# Patient Record
Sex: Female | Born: 1970 | Race: White | Hispanic: No | Marital: Married | Smoking: Never smoker
Health system: Southern US, Community
[De-identification: ages and names within clinical notes are randomized; demographics above are authoritative.]

## PROBLEM LIST (undated history)

## (undated) DIAGNOSIS — R112 Nausea with vomiting, unspecified: Secondary | ICD-10-CM

## (undated) DIAGNOSIS — Z9889 Other specified postprocedural states: Secondary | ICD-10-CM

## (undated) DIAGNOSIS — K219 Gastro-esophageal reflux disease without esophagitis: Secondary | ICD-10-CM

## (undated) HISTORY — PX: TONSILLECTOMY: SUR1361

---

## 2012-02-23 ENCOUNTER — Emergency Department: Payer: Self-pay | Admitting: Emergency Medicine

## 2012-02-23 LAB — BASIC METABOLIC PANEL
Calcium, Total: 11.2 mg/dL — ABNORMAL HIGH (ref 8.5–10.1)
Co2: 23 mmol/L (ref 21–32)
Creatinine: 0.67 mg/dL (ref 0.60–1.30)
EGFR (African American): >60
EGFR (Non-African Amer.): >60
Glucose: 96 mg/dL (ref 65–99)
Potassium: 4 mmol/L (ref 3.5–5.1)
Sodium: 140 mmol/L (ref 136–145)

## 2012-02-23 LAB — CK TOTAL AND CKMB (NOT AT ARMC)
CK, Total: 69 U/L (ref 21–215)
CK-MB: 0.8 ng/mL (ref 0.5–3.6)

## 2012-02-23 LAB — TROPONIN I
Troponin-I: <0.02 ng/mL
Troponin-I: <0.02 ng/mL

## 2012-02-23 LAB — CBC
HCT: 41 % (ref 35.0–47.0)
Platelet: 290 10*3/uL (ref 150–440)
RBC: 4.7 10*6/uL (ref 3.80–5.20)
RDW: 14.6 % — ABNORMAL HIGH (ref 11.5–14.5)
WBC: 7 10*3/uL (ref 3.6–11.0)

## 2016-07-21 ENCOUNTER — Other Ambulatory Visit: Payer: Self-pay | Admitting: Obstetrics and Gynecology

## 2016-07-21 DIAGNOSIS — N63 Unspecified lump in unspecified breast: Secondary | ICD-10-CM

## 2016-07-25 ENCOUNTER — Other Ambulatory Visit: Payer: Self-pay | Admitting: Obstetrics and Gynecology

## 2016-07-25 ENCOUNTER — Ambulatory Visit
Admission: RE | Admit: 2016-07-25 | Discharge: 2016-07-25 | Disposition: A | Discharge status: Home / Self Care | Payer: BC Managed Care – PPO | Source: Ambulatory Visit | Attending: Obstetrics and Gynecology | Admitting: Obstetrics and Gynecology

## 2016-07-25 DIAGNOSIS — N6310 Unspecified lump in the right breast, unspecified quadrant: Secondary | ICD-10-CM

## 2016-07-25 DIAGNOSIS — N63 Unspecified lump in unspecified breast: Secondary | ICD-10-CM

## 2016-07-30 ENCOUNTER — Other Ambulatory Visit: Payer: Self-pay | Admitting: Obstetrics and Gynecology

## 2016-07-30 ENCOUNTER — Ambulatory Visit
Admission: RE | Admit: 2016-07-30 | Discharge: 2016-07-30 | Disposition: A | Discharge status: Home / Self Care | Payer: BC Managed Care – PPO | Source: Ambulatory Visit | Attending: Obstetrics and Gynecology | Admitting: Obstetrics and Gynecology

## 2016-07-30 DIAGNOSIS — N6001 Solitary cyst of right breast: Secondary | ICD-10-CM

## 2016-07-30 DIAGNOSIS — N6310 Unspecified lump in the right breast, unspecified quadrant: Secondary | ICD-10-CM

## 2016-07-30 HISTORY — PX: BREAST CYST ASPIRATION: SHX578

## 2017-08-31 ENCOUNTER — Other Ambulatory Visit: Payer: Self-pay | Admitting: Obstetrics and Gynecology

## 2017-08-31 DIAGNOSIS — Z1231 Encounter for screening mammogram for malignant neoplasm of breast: Secondary | ICD-10-CM

## 2017-11-04 ENCOUNTER — Encounter: Payer: Self-pay | Admitting: Obstetrics & Gynecology

## 2017-11-04 ENCOUNTER — Ambulatory Visit: Payer: BC Managed Care – PPO | Admitting: Obstetrics & Gynecology

## 2017-11-04 ENCOUNTER — Ambulatory Visit
Admission: RE | Admit: 2017-11-04 | Discharge: 2017-11-04 | Disposition: A | Discharge status: Home / Self Care | Payer: BC Managed Care – PPO | Source: Ambulatory Visit | Attending: Obstetrics & Gynecology | Admitting: Obstetrics & Gynecology

## 2017-11-04 VITALS — BP 140/90 | Ht 65.0 in | Wt 274.0 lb

## 2017-11-04 DIAGNOSIS — Z124 Encounter for screening for malignant neoplasm of cervix: Secondary | ICD-10-CM

## 2017-11-04 DIAGNOSIS — Z8742 Personal history of other diseases of the female genital tract: Secondary | ICD-10-CM | POA: Diagnosis not present

## 2017-11-04 DIAGNOSIS — R1031 Right lower quadrant pain: Secondary | ICD-10-CM | POA: Diagnosis not present

## 2017-11-04 DIAGNOSIS — D3502 Benign neoplasm of left adrenal gland: Secondary | ICD-10-CM | POA: Diagnosis not present

## 2017-11-04 MED ORDER — CLOTRIMAZOLE-BETAMETHASONE 1-0.05 % EX CREA: 1.0000 "application " | TOPICAL_CREAM | Freq: Two times a day (BID) | CUTANEOUS | 2 refills | Status: DC

## 2017-11-04 MED ORDER — IOPAMIDOL (ISOVUE-370) INJECTION 76%
75.0000 mL | Freq: Once | INTRAVENOUS | Status: AC | PRN
  Administered 2017-11-04: 75 mL via INTRAVENOUS

## 2017-11-04 NOTE — Addendum Note (Signed)
Addended by: Gae Dry on: 11/04/2017 01:53 PM   Modules accepted: Orders

## 2017-11-04 NOTE — Progress Notes (Signed)
Gynecology Pelvic Pain Evaluation   Chief Complaint:  Chief Complaint  Patient presents with  . Pain    right side pain   History of Present Illness:   Patient is a 47 y.o. G0P0000 who LMP was No LMP recorded., presents today for a problem visit.  She complains of pain.   Her pain is localized to the RLQ and this was last night, but today is more on the RUQ radiating to back area, described as sharp and stabbing, began yesterday and its severity is described as yesterday severe and today moderate. The pain radiates to the  back. She has these associated symptoms which include diarrhea, nausea and vomiting. Patient has these modifiers which include pain medication that make it better and unable to associate with any factor that make it worse.  Context includes: spontaneous.  Last PAP 2015.  Never had pregnancy. H/o Clomid use.  Previous evaluation: none. Prior Diagnosis: none.  Does have h/o PCOS. No prior surgeries. Previous Treatment: none.  PMHx: She  has no past medical history on file. Also,  has no past surgical history on file., family history is not on file.,  reports that she has never smoked. She has never used smokeless tobacco. She reports that she drinks alcohol. She reports that she does not use drugs.  She has a current medication list which includes the following prescription(s): clotrimazole-betamethasone. Also, is allergic to penicillins.  Review of Systems  Constitutional: Negative for chills, fever and malaise/fatigue.  HENT: Negative for congestion, sinus pain and sore throat.   Eyes: Negative for blurred vision and pain.  Respiratory: Negative for cough and wheezing.   Cardiovascular: Negative for chest pain and leg swelling.  Gastrointestinal: Negative for abdominal pain, constipation, diarrhea, heartburn, nausea and vomiting.  Genitourinary: Negative for dysuria, frequency, hematuria and urgency.  Musculoskeletal: Negative for back pain, joint pain, myalgias and  neck pain.  Skin: Negative for itching and rash.  Neurological: Negative for dizziness, tremors and weakness.  Endo/Heme/Allergies: Does not bruise/bleed easily.  Psychiatric/Behavioral: Negative for depression. The patient is not nervous/anxious and does not have insomnia.    Objective: BP 140/90   Ht 5\' 5"  (1.651 m)   Wt 274 lb (124.3 kg)   BMI 45.60 kg/m  Physical Exam  Constitutional: She is oriented to person, place, and time. She appears well-developed and well-nourished. No distress.  Genitourinary: Rectum normal, vagina normal and uterus normal. Pelvic exam was performed with patient supine. There is no rash or lesion on the right labia. There is no rash or lesion on the left labia. Vagina exhibits no lesion. No bleeding in the vagina. Right adnexum does not display mass and does not display tenderness. Left adnexum does not display mass and does not display tenderness. Cervix does not exhibit motion tenderness, lesion, friability or polyp.   Uterus is mobile and midaxial. Uterus is not enlarged or exhibiting a mass.  Genitourinary Comments: No mass Mildly T  HENT:  Head: Normocephalic and atraumatic. Head is without laceration.  Right Ear: Hearing normal.  Left Ear: Hearing normal.  Nose: No epistaxis.  No foreign bodies.  Mouth/Throat: Uvula is midline, oropharynx is clear and moist and mucous membranes are normal.  Eyes: Pupils are equal, round, and reactive to light.  Neck: Normal range of motion. Neck supple. No thyromegaly present.  Cardiovascular: Normal rate and regular rhythm. Exam reveals no gallop and no friction rub.  No murmur heard. Pulmonary/Chest: Effort normal and breath sounds normal. No respiratory distress.  She has no wheezes. Right breast exhibits no mass, no skin change and no tenderness. Left breast exhibits no mass, no skin change and no tenderness.  Abdominal: Soft. Bowel sounds are normal. She exhibits no distension. There is tenderness in the right  upper quadrant and right lower quadrant. There is positive Murphy's sign. There is no rigidity, no rebound, no guarding and no tenderness at McBurney's point.  Musculoskeletal: Normal range of motion.  Neurological: She is alert and oriented to person, place, and time. No cranial nerve deficit.  Skin: Skin is warm and dry.  Psychiatric: She has a normal mood and affect. Judgment normal.  Vitals reviewed.  Female chaperone present for pelvic portion of the physical exam  Assessment: 47 y.o. G0P0000 with pain.  1. RLQ abdominal pain, now RUQ pain CT to assess ovary (h/o cysts, this is where pain started) and appendix, gall bladder, other  2. History of PCOS  3. Screening for cervical cancer - IGP, Aptima HPV  Problem List Items Addressed This Visit      Other   RLQ abdominal pain - Primary   Relevant Orders   CBC   BUN   Creatinine   History of PCOS    Other Visit Diagnoses    Screening for cervical cancer       Relevant Orders   IGP, Aptima HPV   Right lower quadrant abdominal pain       Relevant Orders   CT ABDOMEN PELVIS W WO CONTRAST   BUN   Creatinine      Barnett Applebaum, MD, [place], [facility] 11/04/2017  1:39 PM

## 2017-11-05 LAB — CBC
HEMATOCRIT: 38.9 % (ref 34.0–46.6)
HEMOGLOBIN: 12.8 g/dL (ref 11.1–15.9)
MCH: 29.7 pg (ref 26.6–33.0)
MCHC: 32.9 g/dL (ref 31.5–35.7)
MCV: 90 fL (ref 79–97)
Platelets: 284 10*3/uL (ref 150–379)
RBC: 4.31 x10E6/uL (ref 3.77–5.28)
RDW: 14.2 % (ref 12.3–15.4)
WBC: 9.7 10*3/uL (ref 3.4–10.8)

## 2017-11-05 LAB — CREATININE, SERUM
CREATININE: 0.65 mg/dL (ref 0.57–1.00)
GFR calc Af Amer: 124 mL/min/{1.73_m2} (ref >59)
GFR calc non Af Amer: 108 mL/min/{1.73_m2} (ref >59)

## 2017-11-05 LAB — BUN: BUN: 6 mg/dL (ref 6–24)

## 2017-11-06 LAB — IGP, APTIMA HPV
HPV APTIMA: NEGATIVE
PAP SMEAR COMMENT: 0

## 2017-11-11 ENCOUNTER — Ambulatory Visit
Admission: RE | Admit: 2017-11-11 | Discharge: 2017-11-11 | Disposition: A | Discharge status: Home / Self Care | Payer: BC Managed Care – PPO | Source: Ambulatory Visit | Attending: Obstetrics and Gynecology | Admitting: Obstetrics and Gynecology

## 2017-11-11 DIAGNOSIS — Z1231 Encounter for screening mammogram for malignant neoplasm of breast: Secondary | ICD-10-CM

## 2017-11-12 ENCOUNTER — Other Ambulatory Visit: Payer: Self-pay | Admitting: Obstetrics and Gynecology

## 2017-11-12 DIAGNOSIS — N63 Unspecified lump in unspecified breast: Secondary | ICD-10-CM

## 2017-12-14 ENCOUNTER — Ambulatory Visit
Admission: RE | Admit: 2017-12-14 | Discharge: 2017-12-14 | Disposition: A | Discharge status: Home / Self Care | Payer: BC Managed Care – PPO | Source: Ambulatory Visit | Attending: Obstetrics and Gynecology | Admitting: Obstetrics and Gynecology

## 2017-12-14 DIAGNOSIS — N63 Unspecified lump in unspecified breast: Secondary | ICD-10-CM

## 2018-08-09 ENCOUNTER — Encounter: Payer: Self-pay | Admitting: Emergency Medicine

## 2018-08-09 ENCOUNTER — Emergency Department: Payer: BC Managed Care – PPO

## 2018-08-09 ENCOUNTER — Emergency Department
Admission: EM | Admit: 2018-08-09 | Discharge: 2018-08-09 | Disposition: A | Discharge status: Home / Self Care | Payer: BC Managed Care – PPO | Attending: Emergency Medicine | Admitting: Emergency Medicine

## 2018-08-09 ENCOUNTER — Other Ambulatory Visit: Payer: Self-pay

## 2018-08-09 DIAGNOSIS — M5136 Other intervertebral disc degeneration, lumbar region: Secondary | ICD-10-CM | POA: Diagnosis not present

## 2018-08-09 DIAGNOSIS — M47817 Spondylosis without myelopathy or radiculopathy, lumbosacral region: Secondary | ICD-10-CM

## 2018-08-09 DIAGNOSIS — M5489 Other dorsalgia: Secondary | ICD-10-CM | POA: Diagnosis present

## 2018-08-09 LAB — POCT PREGNANCY, URINE: Preg Test, Ur: NEGATIVE

## 2018-08-09 MED ORDER — METHYLPREDNISOLONE SODIUM SUCC 125 MG IJ SOLR
125.0000 mg | Freq: Once | INTRAMUSCULAR | Status: AC
  Administered 2018-08-09: 125 mg via INTRAMUSCULAR
  Filled 2018-08-09: qty 2

## 2018-08-09 MED ORDER — HYDROMORPHONE HCL 1 MG/ML IJ SOLN
1.0000 mg | Freq: Once | INTRAMUSCULAR | Status: AC
  Administered 2018-08-09: 1 mg via INTRAMUSCULAR
  Filled 2018-08-09: qty 1

## 2018-08-09 MED ORDER — CYCLOBENZAPRINE HCL 10 MG PO TABS: 10.0000 mg | ORAL_TABLET | Freq: Three times a day (TID) | ORAL | 0 refills | Status: DC | PRN

## 2018-08-09 MED ORDER — ORPHENADRINE CITRATE 30 MG/ML IJ SOLN
60.0000 mg | Freq: Two times a day (BID) | INTRAMUSCULAR | Status: DC
  Administered 2018-08-09: 60 mg via INTRAMUSCULAR
  Filled 2018-08-09: qty 2

## 2018-08-09 MED ORDER — IBUPROFEN 600 MG PO TABS: 600.0000 mg | ORAL_TABLET | Freq: Three times a day (TID) | ORAL | 0 refills | Status: AC | PRN

## 2018-08-09 MED ORDER — OXYCODONE-ACETAMINOPHEN 7.5-325 MG PO TABS: 1.0000 | ORAL_TABLET | Freq: Four times a day (QID) | ORAL | 0 refills | Status: DC | PRN

## 2018-08-09 NOTE — ED Triage Notes (Signed)
Pt here with c/o mid to lower back pain, states hx of slipped disc, bent over this weekend and felt the pain, slow walk to triage.

## 2018-08-09 NOTE — ED Notes (Signed)
See triage note  States she has a hx of "slipped disc"   States she was leaning over to wash her hair and felt her back slip at that time  States pain is going across lower back  Does not radiate into legs

## 2018-08-09 NOTE — ED Provider Notes (Signed)
[facility] Emergency Department Provider Note   ____________________________________________   First MD Initiated Contact with Patient 08/09/18 [phone]     (approximate)  I have reviewed the triage vital signs and the nursing notes.   HISTORY  Chief Complaint Back Pain    HPI Leslie Waller is a 48 y.o. female patient presents with mid back pain.  Patient states she was bending over washing her hair yesterday when she felt a sharp pain with a component to her lower extremities.  Patient denies bladder bowel dysfunction.  Patient says she has a history of slipped disc in the lumbar spine.  Patient rates her pain as a 8/10.  Patient scribed pain is "sharp".  Patient state pain increases with standing and ambulation.  Patient states pain decreases with laying on right side.    History reviewed. No pertinent past medical history.  Patient Active Problem List   Diagnosis Date Noted  . RLQ abdominal pain 11/04/2017  . History of PCOS 11/04/2017    Past Surgical History:  Procedure Laterality Date  . BREAST CYST ASPIRATION Right 07/30/2016    Prior to Admission medications   Medication Sig Start Date End Date Taking? Authorizing Provider  clotrimazole-betamethasone (LOTRISONE) cream Apply 1 application topically 2 (two) times daily. To affected areas 11/04/17   Gae Dry, MD  cyclobenzaprine (FLEXERIL) 10 MG tablet Take 1 tablet (10 mg total) by mouth 3 (three) times daily as needed. 08/09/18   Sable Feil, PA-C  ibuprofen (ADVIL,MOTRIN) 600 MG tablet Take 1 tablet (600 mg total) by mouth every 8 (eight) hours as needed. 08/09/18   Sable Feil, PA-C  oxyCODONE-acetaminophen (PERCOCET) 7.5-325 MG tablet Take 1 tablet by mouth every 6 (six) hours as needed for severe pain. 08/09/18   Sable Feil, PA-C    Allergies Penicillins  No family history on file.  Social History Social History   Tobacco Use  . Smoking status: Never Smoker  .  Smokeless tobacco: Never Used  Substance Use Topics  . Alcohol use: Yes  . Drug use: Never    Review of Systems Constitutional: No fever/chills Eyes: No visual changes. ENT: No sore throat. Cardiovascular: Denies chest pain. Respiratory: Denies shortness of breath. Gastrointestinal: No abdominal pain.  No nausea, no vomiting.  No diarrhea.  No constipation. Genitourinary: Negative for dysuria. Musculoskeletal: Positive for back pain. Skin: Negative for rash. Neurological: Negative for headaches, focal weakness or numbness. Allergic/Immunilogical: Penicillin ____________________________________________   PHYSICAL EXAM:  VITAL SIGNS: ED Triage Vitals  Enc Vitals Group     BP 08/09/18 0753 138/76     Pulse Rate 08/09/18 0753 (!) 59     Resp 08/09/18 0753 16     Temp 08/09/18 0753 97.8 F (36.6 C)     Temp Source 08/09/18 0753 Oral     SpO2 08/09/18 0753 98 %     Weight 08/09/18 0748 235 lb (106.6 kg)     Height 08/09/18 0748 5\' 5"  (1.651 m)     Head Circumference --      Peak Flow --      Pain Score 08/09/18 0748 8     Pain Loc --      Pain Edu? --      Excl. in [place]? --     Constitutional: Alert and oriented.  Moderate distress.  Hematological/Lymphatic/Immunilogical: No cervical lymphadenopathy. Cardiovascular: Normal rate, regular rhythm. Grossly normal heart sounds.  Good peripheral circulation. Respiratory: Normal respiratory effort.  No retractions.  Lungs CTAB. Gastrointestinal: Soft and nontender. No distention. No abdominal bruits. No CVA tenderness. Musculoskeletal: No obvious spinal deformity.  Patient has moderate guarding palpation of L4-S1.  Neurologic:  Normal speech and language. No gross focal neurologic deficits are appreciated. No gait instability.  Patient has negative straight leg test. Skin:  Skin is warm, dry and intact. No rash noted. Psychiatric: Mood and affect are normal. Speech and behavior are  normal.  ____________________________________________   LABS (all labs ordered are listed, but only abnormal results are displayed)  Labs Reviewed  POCT PREGNANCY, URINE  POC URINE PREG, ED   ____________________________________________  EKG   ____________________________________________  RADIOLOGY  ED MD interpretation:    Official radiology report(s): Dg Lumbar Spine 2-3 Views  Result Date: 08/09/2018 CLINICAL DATA:  Acute low back pain with radiculopathy. EXAM: LUMBAR SPINE - 2-3 VIEW COMPARISON:  CT scan of Nov 04, 2017. FINDINGS: No fracture or spondylolisthesis is noted. Moderate degenerative disc disease is noted at L4-5. Remaining disc spaces are unremarkable. IMPRESSION: Moderate degenerative disc disease is noted at L4-5. No acute abnormality seen in the lumbar spine. Electronically Signed   By: Marijo Conception, M.D.   On: 08/09/2018 09:33    ____________________________________________   PROCEDURES  Procedure(s) performed:   Procedures  Critical Care performed:   ____________________________________________   INITIAL IMPRESSION / ASSESSMENT AND PLAN / ED COURSE  As part of my medical decision making, I reviewed the following data within the [facility]     Patient presents with acute low back pain secondary to prolonged flexion incident yesterday.  Discussed x-ray findings with patient.  Patient given discharge care instruction work note.  Patient advised take medication as directed and follow-up with PCP for continued care.      ____________________________________________   FINAL CLINICAL IMPRESSION(S) / ED DIAGNOSES  Final diagnoses:  DJD (degenerative joint disease), lumbosacral     ED Discharge Orders         Ordered    oxyCODONE-acetaminophen (PERCOCET) 7.5-325 MG tablet  Every 6 hours PRN     08/09/18 0949    cyclobenzaprine (FLEXERIL) 10 MG tablet  3 times daily PRN     08/09/18 0949    ibuprofen (ADVIL,MOTRIN) 600  MG tablet  Every 8 hours PRN     08/09/18 0949           Note:  This document was prepared using Dragon voice recognition software and may include unintentional dictation errors.    Sable Feil, PA-C 08/09/18 1010    Lavonia Drafts, MD 08/09/18 1146

## 2019-01-31 ENCOUNTER — Other Ambulatory Visit: Payer: Self-pay

## 2019-01-31 ENCOUNTER — Other Ambulatory Visit: Payer: Self-pay | Admitting: Obstetrics & Gynecology

## 2019-01-31 DIAGNOSIS — Z1231 Encounter for screening mammogram for malignant neoplasm of breast: Secondary | ICD-10-CM

## 2019-02-01 ENCOUNTER — Ambulatory Visit
Admission: RE | Admit: 2019-02-01 | Discharge: 2019-02-01 | Disposition: A | Discharge status: Home / Self Care | Payer: BC Managed Care – PPO | Source: Ambulatory Visit | Attending: Obstetrics & Gynecology | Admitting: Obstetrics & Gynecology

## 2019-02-01 ENCOUNTER — Other Ambulatory Visit: Payer: Self-pay

## 2019-02-01 DIAGNOSIS — Z1231 Encounter for screening mammogram for malignant neoplasm of breast: Secondary | ICD-10-CM

## 2019-05-16 ENCOUNTER — Other Ambulatory Visit: Payer: Self-pay

## 2019-05-16 DIAGNOSIS — Z20822 Contact with and (suspected) exposure to covid-19: Secondary | ICD-10-CM

## 2019-05-17 LAB — NOVEL CORONAVIRUS, NAA: SARS-CoV-2, NAA: NOT DETECTED

## 2019-05-17 LAB — INPATIENT

## 2019-09-12 ENCOUNTER — Other Ambulatory Visit: Payer: Self-pay | Admitting: Nurse Practitioner

## 2019-09-12 ENCOUNTER — Ambulatory Visit
Admission: RE | Admit: 2019-09-12 | Discharge: 2019-09-12 | Disposition: A | Discharge status: Home / Self Care | Payer: BC Managed Care – PPO | Source: Ambulatory Visit | Attending: Nurse Practitioner | Admitting: Nurse Practitioner

## 2019-09-12 ENCOUNTER — Other Ambulatory Visit: Payer: Self-pay

## 2019-09-12 DIAGNOSIS — I83812 Varicose veins of left lower extremities with pain: Secondary | ICD-10-CM

## 2019-10-04 ENCOUNTER — Other Ambulatory Visit: Payer: Self-pay | Admitting: Sports Medicine

## 2019-10-04 DIAGNOSIS — M25562 Pain in left knee: Secondary | ICD-10-CM

## 2019-10-04 DIAGNOSIS — M25462 Effusion, left knee: Secondary | ICD-10-CM

## 2019-10-04 DIAGNOSIS — M1712 Unilateral primary osteoarthritis, left knee: Secondary | ICD-10-CM

## 2019-10-13 ENCOUNTER — Ambulatory Visit
Admission: RE | Admit: 2019-10-13 | Discharge: 2019-10-13 | Disposition: A | Discharge status: Home / Self Care | Payer: BC Managed Care – PPO | Source: Ambulatory Visit | Attending: Sports Medicine | Admitting: Sports Medicine

## 2019-10-13 ENCOUNTER — Other Ambulatory Visit: Payer: Self-pay

## 2019-10-13 DIAGNOSIS — M25462 Effusion, left knee: Secondary | ICD-10-CM

## 2019-10-13 DIAGNOSIS — M1712 Unilateral primary osteoarthritis, left knee: Secondary | ICD-10-CM | POA: Diagnosis present

## 2019-10-13 DIAGNOSIS — M25562 Pain in left knee: Secondary | ICD-10-CM | POA: Diagnosis not present

## 2020-02-24 ENCOUNTER — Other Ambulatory Visit: Payer: Self-pay | Admitting: Obstetrics & Gynecology

## 2020-02-24 DIAGNOSIS — Z1231 Encounter for screening mammogram for malignant neoplasm of breast: Secondary | ICD-10-CM

## 2020-03-07 ENCOUNTER — Other Ambulatory Visit: Payer: Self-pay

## 2020-03-07 ENCOUNTER — Ambulatory Visit
Admission: RE | Admit: 2020-03-07 | Discharge: 2020-03-07 | Disposition: A | Discharge status: Home / Self Care | Payer: BC Managed Care – PPO | Source: Ambulatory Visit | Attending: Obstetrics & Gynecology | Admitting: Obstetrics & Gynecology

## 2020-03-07 DIAGNOSIS — Z1231 Encounter for screening mammogram for malignant neoplasm of breast: Secondary | ICD-10-CM

## 2020-03-09 ENCOUNTER — Other Ambulatory Visit: Payer: Self-pay | Admitting: Obstetrics & Gynecology

## 2020-03-09 DIAGNOSIS — R928 Other abnormal and inconclusive findings on diagnostic imaging of breast: Secondary | ICD-10-CM

## 2020-03-22 ENCOUNTER — Ambulatory Visit
Admission: RE | Admit: 2020-03-22 | Discharge: 2020-03-22 | Disposition: A | Discharge status: Home / Self Care | Payer: BC Managed Care – PPO | Source: Ambulatory Visit | Attending: Obstetrics & Gynecology | Admitting: Obstetrics & Gynecology

## 2020-03-22 ENCOUNTER — Other Ambulatory Visit: Payer: Self-pay | Admitting: Obstetrics & Gynecology

## 2020-03-22 ENCOUNTER — Other Ambulatory Visit: Payer: Self-pay

## 2020-03-22 DIAGNOSIS — R928 Other abnormal and inconclusive findings on diagnostic imaging of breast: Secondary | ICD-10-CM

## 2020-03-22 DIAGNOSIS — N6489 Other specified disorders of breast: Secondary | ICD-10-CM

## 2020-03-29 ENCOUNTER — Other Ambulatory Visit: Payer: Self-pay

## 2020-03-29 ENCOUNTER — Ambulatory Visit
Admission: RE | Admit: 2020-03-29 | Discharge: 2020-03-29 | Disposition: A | Discharge status: Home / Self Care | Payer: BC Managed Care – PPO | Source: Ambulatory Visit | Attending: Obstetrics & Gynecology | Admitting: Obstetrics & Gynecology

## 2020-03-29 DIAGNOSIS — N6489 Other specified disorders of breast: Secondary | ICD-10-CM

## 2020-04-02 ENCOUNTER — Other Ambulatory Visit: Payer: Self-pay | Admitting: Obstetrics & Gynecology

## 2020-04-02 ENCOUNTER — Telehealth: Payer: Self-pay | Admitting: Obstetrics & Gynecology

## 2020-04-02 DIAGNOSIS — R928 Other abnormal and inconclusive findings on diagnostic imaging of breast: Secondary | ICD-10-CM

## 2020-04-02 NOTE — Telephone Encounter (Signed)
Patient is calling needing to speak with you about her mammogram results. Please advise

## 2020-04-19 ENCOUNTER — Other Ambulatory Visit: Payer: Self-pay | Admitting: General Surgery

## 2020-04-19 DIAGNOSIS — N6081 Other benign mammary dysplasias of right breast: Secondary | ICD-10-CM

## 2020-04-19 NOTE — Progress Notes (Signed)
Subjective:     Patient ID: Leslie Waller is a 49 y.o. female.  HPI  The following portions of the patient's history were reviewed and updated as appropriate.This a new patient is here today for: office visit. She is here today for an abnormal mammogram and post stereotatic biopsy on right breast 03-29-20 in [place]. She has not noticed any breat changes.  In February 2018 the patient had an abnormal mammogram and at the time of planned ultrasound-guided biopsy a cyst was ruptured.  No personal history of breast cancer.  No history of trauma.  Denies any family history of breast or colon cancer.  The patient is accompanied today by her fiance, Donnie Klier. The couple are scheduled to be married on May 19, 2020.      Review of Systems  Constitutional: Negative for chills and fever.  Respiratory: Negative for cough.        Chief Complaint  Patient presents with  . Abnormal Mammo     BP 132/84   Pulse 90   Temp 36.7 C (98.1 F)   Ht 165.1 cm (5\' 5" )   Wt (!) 115.2 kg (254 lb)   LMP 04/08/2020   SpO2 96%   BMI 42.27 kg/m       Past Medical History:  Diagnosis Date  . Depression with anxiety 2016  . DJD (degenerative joint disease), lumbar 2003   Herniated lumbar disc  . GERD (gastroesophageal reflux disease)   . History of chicken pox   . History of PCOS   . Obesity   . PONV (postoperative nausea and vomiting)   . Seasonal allergic rhinitis           Past Surgical History:  Procedure Laterality Date  . ASPIRATION CYST BREAST Right 07/30/2016  . BIOPSY BREAST W/ LOC DEVICE PLACEMENT AND STEREOTACTIC GUIDANCE Right 03/29/2020  . TONSILLECTOMY                OB History    Gravida  0   Para  0   Term  0   Preterm  0   AB  0   Living  0     SAB  0   IAB  0   Ectopic  0   Molar  0   Multiple  0   Live Births  0       Obstetric Comments  Age at first period 84 Age of first pregnancy 1         Social History          Socioeconomic History  . Marital status: Single    Spouse name: Not on file  . Number of children: 1  . Years of education: Not on file  . Highest education level: Not on file  Occupational History  . Occupation: Pharmacist, hospital  Tobacco Use  . Smoking status: Never Smoker  . Smokeless tobacco: Never Used  Vaping Use  . Vaping Use: Never used  Substance and Sexual Activity  . Alcohol use: Yes    Alcohol/week: 0.0 standard drinks    Comment: Occasionally  . Drug use: Never  . Sexual activity: Defer  Other Topics Concern  . Not on file  Social History Narrative  . Not on file   Social Determinants of Health   Financial Resource Strain: Not on file  Food Insecurity: Not on file  Transportation Needs: Not on file       Allergies  Allergen Reactions  . Erythromycin Other (See Comments)  Abd cramping   . Penicillins Hives    Current Medications        Current Outpatient Medications  Medication Sig Dispense Refill  . cyclobenzaprine (FLEXERIL) 10 MG tablet Take 1 tablet (10 mg total) by mouth nightly as needed for Muscle spasms Need an appointment for further refills. 30 tablet 1  . ELDERBERRY FRUIT ORAL 1 chewable by mouth daily    . ibuprofen (ADVIL,MOTRIN) 400 MG tablet Take 400 mg by mouth as needed for Pain    . magnesium 250 mg Tab Take 1 tablet by mouth once daily    . omeprazole (PRILOSEC) 40 MG DR capsule Take 1 capsule (40 mg total) by mouth once daily 90 capsule 3  . pyridoxine, vitamin B6, (VITAMIN B-6) 50 MG tablet Take 100 mg by mouth once daily    . azelastine (ASTELIN) 137 mcg nasal spray Place 1 spray into both nostrils 2 (two) times daily (Patient not taking: Reported on 04/19/2020  ) 10 mL 1   No current facility-administered medications for this visit.           Family History  Problem Relation Age of Onset  . Epilepsy Mother   . Breast cancer Neg Hx   . Colon cancer Neg Hx          Objective:   Physical Exam Exam conducted with a chaperone present.  Constitutional:      Appearance: Normal appearance.  Cardiovascular:     Rate and Rhythm: Normal rate and regular rhythm.     Pulses: Normal pulses.     Heart sounds: Normal heart sounds.  Pulmonary:     Effort: Pulmonary effort is normal.     Breath sounds: Normal breath sounds.  Chest:  Breasts:     Right: Normal. No axillary adenopathy or supraclavicular adenopathy.     Left: Normal. No axillary adenopathy or supraclavicular adenopathy.     Musculoskeletal:     Cervical back: Neck supple.  Lymphadenopathy:     Upper Body:     Right upper body: No supraclavicular or axillary adenopathy.     Left upper body: No supraclavicular or axillary adenopathy.  Skin:    General: Skin is warm and dry.  Neurological:     Mental Status: She is alert and oriented to person, place, and time.  Psychiatric:        Mood and Affect: Mood normal.        Behavior: Behavior normal.     Labs and Radiology:  March 29, 2020 biopsy results:  Breast, right, needle core biopsy, inner at middle depth - FLAT EPITHELIAL ATYPIA The volume of the biopsy was less than 1 cm.   February 01, 2019 through March 29, 2020 mammogram and ultrasound studies were independently reviewed.  Ultrasound examination of the breast was undertaken to determine if preoperative wire localization would be required.  In the upper outer quadrant of the right breast at the 1 o'clock position, approximately 12 cm from the nipple a small biopsy cavity measuring at a maximum of 0.4 cm is identified.  It is difficult to confirm the clip within this spot.  This is not felt to be adequate visualization for ultrasound-guided biopsy in the operating room.     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    Plan:     Indications for biopsy were reviewed, approximately 10-15% of people will upstaged to  DCIS.  Low suspicion for upstaging to invasive  cancer.  With her upcoming wedding it is perfectly reasonable to postpone intervention until afterwards.      Patient to follow up as scheduled and is aware to call for any new issues or concerns.  Your procedure is scheduled on: 06-01-20  Entered by Karie Fetch, RN, acting as a scribe for Dr. Hervey Ard, MD.  The documentation recorded by the scribe accurately reflects the service I personally performed and the decisions made by me.   Robert Bellow, MD FACS

## 2020-04-25 ENCOUNTER — Other Ambulatory Visit: Payer: Self-pay | Admitting: General Surgery

## 2020-04-25 DIAGNOSIS — N6081 Other benign mammary dysplasias of right breast: Secondary | ICD-10-CM

## 2020-05-21 ENCOUNTER — Other Ambulatory Visit: Payer: Self-pay

## 2020-05-21 ENCOUNTER — Encounter
Admission: RE | Admit: 2020-05-21 | Discharge: 2020-05-21 | Disposition: A | Discharge status: Home / Self Care | Payer: BC Managed Care – PPO | Source: Ambulatory Visit | Attending: General Surgery | Admitting: General Surgery

## 2020-05-21 HISTORY — DX: Other specified postprocedural states: Z98.890

## 2020-05-21 HISTORY — DX: Nausea with vomiting, unspecified: R11.2

## 2020-05-21 HISTORY — DX: Gastro-esophageal reflux disease without esophagitis: K21.9

## 2020-05-21 NOTE — Patient Instructions (Signed)
Your procedure is scheduled on: 06/01/20 Report to [facility] AT 07:40 AM To find out your arrival time please call [phone] between 1PM - 3PM on 05/31/20.  Remember: Instructions that are not followed completely may result in serious medical risk, up to and including death, or upon the discretion of your surgeon and anesthesiologist your surgery may need to be rescheduled.     _X__ 1. Do not eat food after midnight the night before your procedure.                 No gum chewing or hard candies. You may drink clear liquids up to 2 hours                 before you are scheduled to arrive for your surgery- DO not drink clear                 liquids within 2 hours of the start of your surgery.                 Clear Liquids include:  water, apple juice without pulp, clear carbohydrate                 drink such as Clearfast or Gatorade, Black Coffee or Tea (Do not add                 anything to coffee or tea). Diabetics water only  __X__2.  On the morning of surgery brush your teeth with toothpaste and water, you                 may rinse your mouth with mouthwash if you wish.  Do not swallow any              toothpaste of mouthwash.     _X__ 3.  No Alcohol for 24 hours before or after surgery.   _X__ 4.  Do Not Smoke or use e-cigarettes For 24 Hours Prior to Your Surgery.                 Do not use any chewable tobacco products for at least 6 hours prior to                 surgery.  ____  5.  Bring all medications with you on the day of surgery if instructed.   __X__  6.  Notify your doctor if there is any change in your medical condition      (cold, fever, infections).     Do not wear jewelry, make-up, hairpins, clips or nail polish. Do not wear lotions, powders, or perfumes.  Do not shave 48 hours prior to surgery. Men may shave face and neck. Do not bring valuables to the hospital.    [facility] is not responsible for any belongings or valuables.  Contacts,  dentures/partials or body piercings may not be worn into surgery. Bring a case for your contacts, glasses or hearing aids, a denture cup will be supplied. Leave your suitcase in the car. After surgery it may be brought to your room. For patients admitted to the hospital, discharge time is determined by your treatment team.   Patients discharged the day of surgery will not be allowed to drive home.   Please read over the following fact sheets that you were given:   MRSA Information  __X__ Take these medicines the morning of surgery with A SIP OF WATER:    1. omeprazole (PRILOSEC)  40 MG capsule  2.   3.   4.  5.  6.  ____ Fleet Enema (as directed)   __X__ Use CHG Soap/SAGE wipes as directed  ____ Use inhalers on the day of surgery  ____ Stop metformin/Janumet/Farxiga 2 days prior to surgery    ____ Take 1/2 of usual insulin dose the night before surgery. No insulin the morning          of surgery.   ____ Stop Blood Thinners Coumadin/Plavix/Xarelto/Pleta/Pradaxa/Eliquis/Effient/Aspirin  on   Or contact your Surgeon, Cardiologist or Medical Doctor regarding  ability to stop your blood thinners  __X__ Stop Anti-inflammatories 7 days before surgery such as Advil, Ibuprofen, Motrin,  BC or Goodies Powder, Naprosyn, Naproxen, Aleve, Aspirin    __X__ Stop all herbal supplements, fish oil or vitamin E until after surgery.    ____ Bring C-Pap to the hospital.

## 2020-05-30 ENCOUNTER — Other Ambulatory Visit: Payer: Self-pay

## 2020-05-30 ENCOUNTER — Other Ambulatory Visit
Admission: RE | Admit: 2020-05-30 | Discharge: 2020-05-30 | Disposition: A | Discharge status: Home / Self Care | Payer: BC Managed Care – PPO | Source: Ambulatory Visit | Attending: General Surgery | Admitting: General Surgery

## 2020-05-30 DIAGNOSIS — Z01812 Encounter for preprocedural laboratory examination: Secondary | ICD-10-CM | POA: Insufficient documentation

## 2020-05-30 DIAGNOSIS — N649 Disorder of breast, unspecified: Secondary | ICD-10-CM | POA: Diagnosis present

## 2020-05-30 DIAGNOSIS — Z20822 Contact with and (suspected) exposure to covid-19: Secondary | ICD-10-CM | POA: Insufficient documentation

## 2020-05-30 DIAGNOSIS — N6011 Diffuse cystic mastopathy of right breast: Secondary | ICD-10-CM | POA: Diagnosis not present

## 2020-05-30 DIAGNOSIS — Z880 Allergy status to penicillin: Secondary | ICD-10-CM | POA: Diagnosis not present

## 2020-05-30 LAB — SARS CORONAVIRUS 2 (TAT 6-24 HRS): SARS Coronavirus 2: NEGATIVE

## 2020-05-31 MED ORDER — CHLORHEXIDINE GLUCONATE 0.12 % MT SOLN
15.0000 mL | Freq: Once | OROMUCOSAL | Status: AC
  Administered 2020-06-01: 15 mL via OROMUCOSAL

## 2020-05-31 MED ORDER — ORAL CARE MOUTH RINSE: 15.0000 mL | Freq: Once | OROMUCOSAL | Status: AC

## 2020-05-31 MED ORDER — LACTATED RINGERS IV SOLN: INTRAVENOUS | Status: DC

## 2020-06-01 ENCOUNTER — Ambulatory Visit: Payer: BC Managed Care – PPO | Admitting: Certified Registered"

## 2020-06-01 ENCOUNTER — Ambulatory Visit
Admission: RE | Admit: 2020-06-01 | Discharge: 2020-06-01 | Disposition: A | Discharge status: Home / Self Care | Payer: BC Managed Care – PPO | Source: Ambulatory Visit | Attending: General Surgery | Admitting: General Surgery

## 2020-06-01 ENCOUNTER — Encounter: Payer: Self-pay | Admitting: General Surgery

## 2020-06-01 ENCOUNTER — Encounter
Admission: RE | Disposition: A | Discharge status: Home / Self Care | Payer: Self-pay | Source: Ambulatory Visit | Attending: General Surgery

## 2020-06-01 ENCOUNTER — Other Ambulatory Visit: Payer: Self-pay

## 2020-06-01 DIAGNOSIS — N6011 Diffuse cystic mastopathy of right breast: Secondary | ICD-10-CM | POA: Diagnosis not present

## 2020-06-01 DIAGNOSIS — N6081 Other benign mammary dysplasias of right breast: Secondary | ICD-10-CM

## 2020-06-01 DIAGNOSIS — Z20822 Contact with and (suspected) exposure to covid-19: Secondary | ICD-10-CM | POA: Insufficient documentation

## 2020-06-01 DIAGNOSIS — Z880 Allergy status to penicillin: Secondary | ICD-10-CM | POA: Insufficient documentation

## 2020-06-01 HISTORY — PX: BREAST BIOPSY: SHX20

## 2020-06-01 LAB — POCT PREGNANCY, URINE: Note: NEGATIVE

## 2020-06-01 SURGERY — BREAST BIOPSY WITH NEEDLE LOCALIZATION
Anesthesia: General | Site: Breast | Laterality: Right

## 2020-06-01 MED ORDER — ACETAMINOPHEN 10 MG/ML IV SOLN
INTRAVENOUS | Status: AC
  Filled 2020-06-01: qty 100

## 2020-06-01 MED ORDER — MEPERIDINE HCL 50 MG/ML IJ SOLN: 6.2500 mg | INTRAMUSCULAR | Status: DC | PRN

## 2020-06-01 MED ORDER — HYDROCODONE-ACETAMINOPHEN 5-325 MG PO TABS: 1.0000 | ORAL_TABLET | ORAL | 0 refills | Status: AC | PRN

## 2020-06-01 MED ORDER — DEXAMETHASONE SODIUM PHOSPHATE 10 MG/ML IJ SOLN
INTRAMUSCULAR | Status: DC | PRN
  Administered 2020-06-01: 10 mg via INTRAVENOUS

## 2020-06-01 MED ORDER — ROCURONIUM BROMIDE 100 MG/10ML IV SOLN
INTRAVENOUS | Status: DC | PRN
  Administered 2020-06-01: 50 mg via INTRAVENOUS

## 2020-06-01 MED ORDER — OXYCODONE HCL 5 MG/5ML PO SOLN: 5.0000 mg | Freq: Once | ORAL | Status: DC | PRN

## 2020-06-01 MED ORDER — ONDANSETRON HCL 4 MG/2ML IJ SOLN
INTRAMUSCULAR | Status: DC | PRN
  Administered 2020-06-01: 4 mg via INTRAVENOUS

## 2020-06-01 MED ORDER — LIDOCAINE HCL (PF) 2 % IJ SOLN
INTRAMUSCULAR | Status: AC
  Filled 2020-06-01: qty 5

## 2020-06-01 MED ORDER — SODIUM CHLORIDE FLUSH 0.9 % IV SOLN
INTRAVENOUS | Status: AC
  Filled 2020-06-01: qty 10

## 2020-06-01 MED ORDER — ONDANSETRON HCL 4 MG/2ML IJ SOLN
INTRAMUSCULAR | Status: AC
  Filled 2020-06-01: qty 2

## 2020-06-01 MED ORDER — LORAZEPAM 2 MG/ML IJ SOLN: 1.0000 mg | Freq: Once | INTRAMUSCULAR | Status: DC | PRN

## 2020-06-01 MED ORDER — MIDAZOLAM HCL 2 MG/2ML IJ SOLN
INTRAMUSCULAR | Status: DC | PRN
  Administered 2020-06-01: 2 mg via INTRAVENOUS

## 2020-06-01 MED ORDER — PROPOFOL 10 MG/ML IV BOLUS
INTRAVENOUS | Status: DC | PRN
  Administered 2020-06-01: 200 mg via INTRAVENOUS

## 2020-06-01 MED ORDER — PROPOFOL 500 MG/50ML IV EMUL
INTRAVENOUS | Status: AC
  Filled 2020-06-01: qty 50

## 2020-06-01 MED ORDER — MIDAZOLAM HCL 2 MG/2ML IJ SOLN
INTRAMUSCULAR | Status: AC
  Filled 2020-06-01: qty 2

## 2020-06-01 MED ORDER — PROPOFOL 10 MG/ML IV BOLUS
INTRAVENOUS | Status: AC
  Filled 2020-06-01: qty 20

## 2020-06-01 MED ORDER — FENTANYL CITRATE (PF) 100 MCG/2ML IJ SOLN
INTRAMUSCULAR | Status: AC
  Filled 2020-06-01: qty 2

## 2020-06-01 MED ORDER — CHLORHEXIDINE GLUCONATE 0.12 % MT SOLN
OROMUCOSAL | Status: AC
  Filled 2020-06-01: qty 15

## 2020-06-01 MED ORDER — BUPIVACAINE HCL 0.5 % IJ SOLN
INTRAMUSCULAR | Status: DC | PRN
  Administered 2020-06-01: 30 mL

## 2020-06-01 MED ORDER — PROPOFOL 10 MG/ML IV BOLUS
INTRAVENOUS | Status: DC | PRN
  Administered 2020-06-01: 150 ug/kg/min via INTRAVENOUS

## 2020-06-01 MED ORDER — PROMETHAZINE HCL 25 MG/ML IJ SOLN
INTRAMUSCULAR | Status: AC
  Administered 2020-06-01: 6.25 mg via INTRAVENOUS
  Filled 2020-06-01: qty 1

## 2020-06-01 MED ORDER — DEXAMETHASONE SODIUM PHOSPHATE 10 MG/ML IJ SOLN
INTRAMUSCULAR | Status: AC
  Filled 2020-06-01: qty 1

## 2020-06-01 MED ORDER — ACETAMINOPHEN 10 MG/ML IV SOLN
INTRAVENOUS | Status: DC | PRN
  Administered 2020-06-01: 1000 mg via INTRAVENOUS

## 2020-06-01 MED ORDER — OXYCODONE HCL 5 MG PO TABS: 5.0000 mg | ORAL_TABLET | Freq: Once | ORAL | Status: DC | PRN

## 2020-06-01 MED ORDER — SUGAMMADEX SODIUM 200 MG/2ML IV SOLN
INTRAVENOUS | Status: DC | PRN
  Administered 2020-06-01: 200 mg via INTRAVENOUS

## 2020-06-01 MED ORDER — HYDROMORPHONE HCL 1 MG/ML IJ SOLN: 0.2500 mg | INTRAMUSCULAR | Status: DC | PRN

## 2020-06-01 MED ORDER — LIDOCAINE HCL (CARDIAC) PF 100 MG/5ML IV SOSY
PREFILLED_SYRINGE | INTRAVENOUS | Status: DC | PRN
  Administered 2020-06-01: 100 mg via INTRAVENOUS

## 2020-06-01 MED ORDER — PROMETHAZINE HCL 25 MG/ML IJ SOLN: 6.2500 mg | INTRAMUSCULAR | Status: DC | PRN

## 2020-06-01 MED ORDER — FENTANYL CITRATE (PF) 100 MCG/2ML IJ SOLN
INTRAMUSCULAR | Status: DC | PRN
  Administered 2020-06-01: 100 ug via INTRAVENOUS

## 2020-06-01 MED ORDER — DROPERIDOL 2.5 MG/ML IJ SOLN
0.6250 mg | Freq: Once | INTRAMUSCULAR | Status: DC | PRN
  Filled 2020-06-01: qty 2

## 2020-06-01 SURGICAL SUPPLY — 46 items
BINDER BREAST LRG (GAUZE/BANDAGES/DRESSINGS) IMPLANT
BINDER BREAST MEDIUM (GAUZE/BANDAGES/DRESSINGS) IMPLANT
BINDER BREAST XLRG (GAUZE/BANDAGES/DRESSINGS) IMPLANT
BINDER BREAST XXLRG (GAUZE/BANDAGES/DRESSINGS) IMPLANT
BLADE BOVIE TIP EXT 4 (BLADE) IMPLANT
BLADE SURG 15 STRL SS SAFETY (BLADE) ×6 IMPLANT
CANISTER SUCT 1200ML W/VALVE (MISCELLANEOUS) IMPLANT
CHLORAPREP W/TINT 26 (MISCELLANEOUS) ×6 IMPLANT
CLOSURE WOUND 1/2 X4 (GAUZE/BANDAGES/DRESSINGS) ×1
CNTNR SPEC 2.5X3XGRAD LEK (MISCELLANEOUS)
CONT SPEC 4OZ STER OR WHT (MISCELLANEOUS)
CONTAINER SPEC 2.5X3XGRAD LEK (MISCELLANEOUS) IMPLANT
COVER PROBE FLX POLY STRL (MISCELLANEOUS) ×3 IMPLANT
COVER WAND RF STERILE (DRAPES) ×3 IMPLANT
DEVICE DUBIN SPECIMEN MAMMOGRA (MISCELLANEOUS) ×3 IMPLANT
DRAPE LAPAROTOMY 100X77 ABD (DRAPES) ×3 IMPLANT
DRSG GAUZE FLUFF 36X18 (GAUZE/BANDAGES/DRESSINGS) ×3 IMPLANT
DRSG TELFA 4X3 1S NADH ST (GAUZE/BANDAGES/DRESSINGS) ×6 IMPLANT
ELECT CAUTERY BLADE TIP 2.5 (TIP) ×3
ELECT REM PT RETURN 9FT ADLT (ELECTROSURGICAL) ×3
ELECTRODE CAUTERY BLDE TIP 2.5 (TIP) ×1 IMPLANT
ELECTRODE REM PT RTRN 9FT ADLT (ELECTROSURGICAL) ×1 IMPLANT
GLOVE BIO SURGEON STRL SZ7.5 (GLOVE) ×3 IMPLANT
GLOVE INDICATOR 8.0 STRL GRN (GLOVE) ×3 IMPLANT
GOWN STRL REUS W/ TWL LRG LVL3 (GOWN DISPOSABLE) ×2 IMPLANT
GOWN STRL REUS W/TWL LRG LVL3 (GOWN DISPOSABLE) ×4
KIT TURNOVER KIT A (KITS) ×3 IMPLANT
LABEL OR SOLS (LABEL) ×3 IMPLANT
MANIFOLD NEPTUNE II (INSTRUMENTS) ×3 IMPLANT
MARGIN MAP 10MM (MISCELLANEOUS) ×3 IMPLANT
NEEDLE HYPO 22GX1.5 SAFETY (NEEDLE) ×3 IMPLANT
NEEDLE HYPO 25X1 1.5 SAFETY (NEEDLE) ×3 IMPLANT
PACK BASIN MINOR (MISCELLANEOUS) ×3 IMPLANT
RETRACTOR RING XSMALL (MISCELLANEOUS) IMPLANT
RTRCTR WOUND ALEXIS 13CM XS SH (MISCELLANEOUS)
STRIP CLOSURE SKIN 1/2X4 (GAUZE/BANDAGES/DRESSINGS) ×2 IMPLANT
SUT ETHILON 3-0 FS-10 30 BLK (SUTURE) ×3
SUT VIC AB 2-0 CT1 27 (SUTURE) ×2
SUT VIC AB 2-0 CT1 TAPERPNT 27 (SUTURE) ×1 IMPLANT
SUT VIC AB 4-0 FS2 27 (SUTURE) ×3 IMPLANT
SUTURE EHLN 3-0 FS-10 30 BLK (SUTURE) ×1 IMPLANT
SWABSTK COMLB BENZOIN TINCTURE (MISCELLANEOUS) ×3 IMPLANT
SYR 10ML LL (SYRINGE) ×3 IMPLANT
SYR BULB IRRIG 60ML STRL (SYRINGE) ×3 IMPLANT
TAPE TRANSPORE STRL 2 31045 (GAUZE/BANDAGES/DRESSINGS) ×3 IMPLANT
WATER STERILE IRR 1000ML POUR (IV SOLUTION) ×3 IMPLANT

## 2020-06-01 NOTE — H&P (Signed)
Leslie Waller [id number] Feb 21, 1971     HPI:  49 y/o woman with biopsy showing flat epithelial hyperplasia. For excision biopsy.   Medications Prior to Admission  Medication Sig Dispense Refill Last Dose  . BLACK ELDERBERRY PO Take 1 capsule by mouth daily.     . Magnesium 250 MG TABS Take 250 mg by mouth daily.     Marland Kitchen omeprazole (PRILOSEC) 40 MG capsule Take 40 mg by mouth daily.     Marland Kitchen pyridOXINE (VITAMIN B-6) 100 MG tablet Take 100 mg by mouth daily.     . cyclobenzaprine (FLEXERIL) 10 MG tablet Take 1 tablet (10 mg total) by mouth 3 (three) times daily as needed. 15 tablet 0 Not Taking at Unknown time  . ibuprofen (ADVIL,MOTRIN) 600 MG tablet Take 1 tablet (600 mg total) by mouth every 8 (eight) hours as needed. 15 tablet 0 Not Taking at Unknown time   Allergies  Allergen Reactions  . Penicillins Hives and Rash   Past Medical History:  Diagnosis Date  . GERD (gastroesophageal reflux disease)   . PONV (postoperative nausea and vomiting)    Past Surgical History:  Procedure Laterality Date  . BREAST CYST ASPIRATION Right 07/30/2016  . TONSILLECTOMY     Social History   Socioeconomic History  . Marital status: Single    Spouse name: Not on file  . Number of children: Not on file  . Years of education: Not on file  . Highest education level: Not on file  Occupational History  . Not on file  Tobacco Use  . Smoking status: Never Smoker  . Smokeless tobacco: Never Used  Vaping Use  . Vaping Use: Never used  Substance and Sexual Activity  . Alcohol use: Yes    Comment: occ  . Drug use: Never  . Sexual activity: Yes    Birth control/protection: None  Other Topics Concern  . Not on file  Social History Narrative  . Not on file   Social Determinants of Health   Financial Resource Strain:   . Difficulty of Paying Living Expenses: Not on file  Food Insecurity:   . Worried About Charity fundraiser in the Last Year: Not on file  . Ran Out of Food in the Last Year:  Not on file  Transportation Needs:   . Lack of Transportation (Medical): Not on file  . Lack of Transportation (Non-Medical): Not on file  Physical Activity:   . Days of Exercise per Week: Not on file  . Minutes of Exercise per Session: Not on file  Stress:   . Feeling of Stress : Not on file  Social Connections:   . Frequency of Communication with Friends and Family: Not on file  . Frequency of Social Gatherings with Friends and Family: Not on file  . Attends Religious Services: Not on file  . Active Member of Clubs or Organizations: Not on file  . Attends Archivist Meetings: Not on file  . Marital Status: Not on file  Intimate Partner Violence:   . Fear of Current or Ex-Partner: Not on file  . Emotionally Abused: Not on file  . Physically Abused: Not on file  . Sexually Abused: Not on file   Social History   Social History Narrative  . Not on file     ROS: Negative.     PE: HEENT: Negative. Lungs: Clear. Cardio: RR.   Assessment/Plan:  Proceed with planned right breast biopsy.    [facility] 06/01/2020

## 2020-06-01 NOTE — Transfer of Care (Signed)
Immediate Anesthesia Transfer of Care Note  Patient: Leslie Waller  Procedure(s) Performed: BREAST BIOPSY WITH NEEDLE LOCALIZATION (Right Breast)  Patient Location: PACU  Anesthesia Type:General  Level of Consciousness: awake, alert  and oriented  Airway & Oxygen Therapy: Patient Spontanous Breathing and Patient connected to face mask oxygen  Post-op Assessment: Report given to RN and Post -op Vital signs reviewed and stable  Post vital signs: stable  Last Vitals:  Vitals Value Taken Time  BP 138/76 06/01/20 1026  Temp    Pulse 87 06/01/20 1028  Resp 8 06/01/20 1028  SpO2 98 % 06/01/20 1028  Vitals shown include unvalidated device data.  Last Pain:  Vitals:   06/01/20 0923  TempSrc: Temporal  PainSc: 0-No pain         Complications: No complications documented.

## 2020-06-01 NOTE — Anesthesia Postprocedure Evaluation (Signed)
Anesthesia Post Note  Patient: Leslie Waller Ward Pore  Procedure(s) Performed: BREAST BIOPSY WITH NEEDLE LOCALIZATION (Right Breast)  Patient location during evaluation: PACU Anesthesia Type: General Level of consciousness: awake Pain management: pain level controlled Vital Signs Assessment: post-procedure vital signs reviewed and stable Respiratory status: spontaneous breathing Cardiovascular status: blood pressure returned to baseline and stable Anesthetic complications: no   No complications documented.   Last Vitals:  Vitals:   06/01/20 1112 06/01/20 1128  BP: 124/84 (!) 158/66  Pulse: 75 77  Resp: 15 16  Temp: 36.9 C 36.9 C  SpO2: 91% 97%    Last Pain:  Vitals:   06/01/20 1128  TempSrc: Temporal  PainSc: 0-No pain                 Neva Seat

## 2020-06-01 NOTE — Discharge Instructions (Signed)

## 2020-06-01 NOTE — OR Nursing (Signed)
Per Dr. Bary Castilla verbal, he does not need to see pt in postop prior to discharge.

## 2020-06-01 NOTE — Anesthesia Preprocedure Evaluation (Signed)
Anesthesia Evaluation  Patient identified by MRN, date of birth, ID band Patient awake    Reviewed: Allergy & Precautions, NPO status , Patient's Chart, lab work & pertinent test results  History of Anesthesia Complications (+) PONV and history of anesthetic complications  Airway Mallampati: II       Dental no notable dental hx. (+) Teeth Intact   Pulmonary neg pulmonary ROS,    Pulmonary exam normal breath sounds clear to auscultation       Cardiovascular negative cardio ROS Normal cardiovascular exam Rhythm:Regular Rate:Normal     Neuro/Psych negative neurological ROS  negative psych ROS   GI/Hepatic Neg liver ROS, GERD  ,  Endo/Other  negative endocrine ROS  Renal/GU negative Renal ROS  negative genitourinary   Musculoskeletal negative musculoskeletal ROS (+)   Abdominal   Peds negative pediatric ROS (+)  Hematology negative hematology ROS (+)   Anesthesia Other Findings   Reproductive/Obstetrics negative OB ROS                             Anesthesia Physical Anesthesia Plan  ASA: II  Anesthesia Plan: General   Post-op Pain Management:    Induction:   PONV Risk Score and Plan: 4 or greater and Ondansetron, Dexamethasone and Scopolamine patch - Pre-op  Airway Management Planned: Oral ETT  Additional Equipment:   Intra-op Plan:   Post-operative Plan: Extubation in OR  Informed Consent: I have reviewed the patients History and Physical, chart, labs and discussed the procedure including the risks, benefits and alternatives for the proposed anesthesia with the patient or authorized representative who has indicated his/her understanding and acceptance.     Dental advisory given  Plan Discussed with: CRNA, Anesthesiologist and Surgeon  Anesthesia Plan Comments:         Anesthesia Quick Evaluation

## 2020-06-01 NOTE — Op Note (Signed)
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  Postoperative diagnosis: Same.  Operative procedure: Right breast biopsy with wire localization.  Operating surgeon: Hervey Ard, MD.  Anesthesia: General by endotracheal, Marcaine 0.5%, plain, 30 cc.  Estimated blood loss 3 cc.  Medical note: This 48 year old woman had a mammogram and subsequent biopsy showing flat epithelial atypia.  She is felt to be a candidate for excisional biopsy.  She underwent wire localization this morning and did well with this.  The wire was well past the previously placed localizing clip.  Operative note: With the patient under adequate general endotracheal anesthesia the wire was trimmed to length and the breast cleansed with ChloraPrep and draped.  Ultrasound was used to identify the previous biopsy site now clearly evident with the wire passing through it.  As noted above, the wire extended approximately 5 cm beyond the lesion.  The area was infiltrated with local anesthesia.  A small curvilinear incision in the upper outer quadrant was made carried down through skin subtendinous tissue with further hemostasis by electrocautery.  Thick breast flaps were then elevated and a 3 x 3 x 4 cm block of tissue was excised orientated and specimen radiograph confirmed the previously placed clip and an intact wire.  After good hemostasis with electrocautery the deep tissue was approximated with interrupted 2-0 Vicryl figure-of-eight sutures.  The superficial adipose layer was approximated in similar fashion.  The skin was closed with a running 4-0 Vicryl subcuticular suture.  Benzoin, Steri-Strips, Telfa and Tegaderm dressing was applied.  The patient tolerated the procedure well and was taken recovery in stable condition.

## 2020-06-01 NOTE — Anesthesia Procedure Notes (Signed)
Procedure Name: Intubation Date/Time: 06/01/2020 9:45 AM Performed by: Natasha Mead, CRNA Pre-anesthesia Checklist: Patient identified, Emergency Drugs available, Suction available and Patient being monitored Patient Re-evaluated:Patient Re-evaluated prior to induction Oxygen Delivery Method: Circle system utilized Preoxygenation: Pre-oxygenation with 100% oxygen Induction Type: IV induction Ventilation: Mask ventilation without difficulty Laryngoscope Size: Miller and 2 Grade View: Grade II Tube type: Oral Number of attempts: 1 Airway Equipment and Method: Stylet and Oral airway Placement Confirmation: ETT inserted through vocal cords under direct vision,  positive ETCO2 and breath sounds checked- equal and bilateral Tube secured with: Tape Dental Injury: Teeth and Oropharynx as per pre-operative assessment

## 2020-06-02 ENCOUNTER — Encounter: Payer: Self-pay | Admitting: General Surgery

## 2020-06-04 LAB — SURGICAL PATHOLOGY

## 2020-07-17 ENCOUNTER — Ambulatory Visit
Admission: EM | Admit: 2020-07-17 | Discharge: 2020-07-17 | Disposition: A | Discharge status: Home / Self Care | Payer: BC Managed Care – PPO | Attending: Internal Medicine | Admitting: Internal Medicine

## 2020-07-17 ENCOUNTER — Other Ambulatory Visit: Payer: Self-pay

## 2020-07-17 DIAGNOSIS — Z1152 Encounter for screening for COVID-19: Secondary | ICD-10-CM

## 2020-07-17 DIAGNOSIS — Z20822 Contact with and (suspected) exposure to covid-19: Secondary | ICD-10-CM | POA: Diagnosis not present

## 2020-07-17 NOTE — ED Triage Notes (Signed)
Nurse Visit COVID test.  Asymptomatic exposure.

## 2020-07-19 LAB — NOVEL CORONAVIRUS, NAA: SARS-CoV-2, NAA: NOT DETECTED

## 2020-07-19 LAB — SARS-COV-2, NAA 2 DAY TAT

## 2021-07-08 ENCOUNTER — Other Ambulatory Visit: Payer: Self-pay | Admitting: Obstetrics & Gynecology

## 2021-07-08 DIAGNOSIS — Z1231 Encounter for screening mammogram for malignant neoplasm of breast: Secondary | ICD-10-CM

## 2021-07-11 ENCOUNTER — Other Ambulatory Visit: Payer: Self-pay

## 2021-07-11 ENCOUNTER — Ambulatory Visit (INDEPENDENT_AMBULATORY_CARE_PROVIDER_SITE_OTHER): Payer: BC Managed Care – PPO

## 2021-07-11 ENCOUNTER — Ambulatory Visit: Payer: BC Managed Care – PPO | Admitting: Podiatry

## 2021-07-11 VITALS — BP 125/79 | HR 76 | Temp 97.9°F | Resp 16

## 2021-07-11 DIAGNOSIS — M722 Plantar fascial fibromatosis: Secondary | ICD-10-CM

## 2021-07-11 NOTE — Progress Notes (Signed)
Subjective:  Patient ID: Leslie Waller, female    DOB: 1970-07-26,  MRN: [id number]  Chief Complaint  Patient presents with   Foot Pain    "Severe pain in both of my feet, the heels."    51 y.o. female presents with the above complaint.  Patient presents with complaint bilateral heel pain that has been going on for quite some time is progressive gotten worse.  Is been getting worse over the last few months.  Patient is a hurts with ambulation and taking the first up in the morning is very painful.  She has not seen anyone else prior to seeing me.  She denies any other acute complaints.   Review of Systems: Negative except as noted in the HPI. Denies N/V/F/Ch.  Past Medical History:  Diagnosis Date   GERD (gastroesophageal reflux disease)    PONV (postoperative nausea and vomiting)     Current Outpatient Medications:    BLACK ELDERBERRY PO, Take 1 capsule by mouth daily., Disp: , Rfl:    cyclobenzaprine (FLEXERIL) 10 MG tablet, Take 1 tablet (10 mg total) by mouth 3 (three) times daily as needed., Disp: 15 tablet, Rfl: 0   ibuprofen (ADVIL,MOTRIN) 600 MG tablet, Take 1 tablet (600 mg total) by mouth every 8 (eight) hours as needed., Disp: 15 tablet, Rfl: 0   Magnesium 250 MG TABS, Take 250 mg by mouth daily., Disp: , Rfl:    omeprazole (PRILOSEC) 40 MG capsule, Take 40 mg by mouth daily., Disp: , Rfl:    pyridOXINE (VITAMIN B-6) 100 MG tablet, Take 100 mg by mouth daily., Disp: , Rfl:   Social History   Tobacco Use  Smoking Status Never  Smokeless Tobacco Never    Allergies  Allergen Reactions   Penicillins Hives and Rash   Objective:   Vitals:   07/11/21 1054  BP: 125/79  Pulse: 76  Resp: 16  Temp: 97.9 F (36.6 C)   There is no height or weight on file to calculate BMI. Constitutional Well developed. Well nourished.  Vascular Dorsalis pedis pulses palpable bilaterally. Posterior tibial pulses palpable bilaterally. Capillary refill normal to all digits.  No  cyanosis or clubbing noted. Pedal hair growth normal.  Neurologic Normal speech. Oriented to person, place, and time. Epicritic sensation to light touch grossly present bilaterally.  Dermatologic Nails well groomed and normal in appearance. No open wounds. No skin lesions.  Orthopedic: Normal joint ROM without pain or crepitus bilaterally. No visible deformities. Tender to palpation at the calcaneal tuber bilaterally. No pain with calcaneal squeeze bilaterally. Ankle ROM diminished range of motion bilaterally. Silfverskiold Test: positive bilaterally.   Radiographs: Taken and reviewed. No acute fractures or dislocations. No evidence of stress fracture.  Plantar heel spur present. Posterior heel spur present.   Assessment:   1. Plantar fasciitis of right foot   2. Plantar fasciitis of left foot    Plan:  Patient was evaluated and treated and all questions answered.  Plantar Fasciitis, bilaterally - XR reviewed as above.  - Educated on icing and stretching. Instructions given.  - Injection delivered to the plantar fascia as below. - DME: Plantar fascial brace dispensed to support the medial longitudinal arch of the foot and offload pressure from the heel and prevent arch collapse during weightbearing - Pharmacologic management: None  Procedure: Injection Tendon/Ligament Location: Bilateral plantar fascia at the glabrous junction; medial approach. Skin Prep: alcohol Injectate: 0.5 cc 0.5% marcaine plain, 0.5 cc of 1% Lidocaine, 0.5 cc kenalog 10. Disposition: Patient  tolerated procedure well. Injection site dressed with a band-aid.  No follow-ups on file.

## 2021-07-24 ENCOUNTER — Ambulatory Visit
Admission: RE | Admit: 2021-07-24 | Discharge: 2021-07-24 | Disposition: A | Discharge status: Home / Self Care | Payer: BC Managed Care – PPO | Source: Ambulatory Visit | Attending: Obstetrics & Gynecology | Admitting: Obstetrics & Gynecology

## 2021-07-24 DIAGNOSIS — Z1231 Encounter for screening mammogram for malignant neoplasm of breast: Secondary | ICD-10-CM

## 2021-07-25 ENCOUNTER — Other Ambulatory Visit: Payer: Self-pay | Admitting: Obstetrics & Gynecology

## 2021-07-25 DIAGNOSIS — R928 Other abnormal and inconclusive findings on diagnostic imaging of breast: Secondary | ICD-10-CM

## 2021-07-26 ENCOUNTER — Other Ambulatory Visit: Payer: Self-pay | Admitting: Obstetrics & Gynecology

## 2021-07-26 DIAGNOSIS — R928 Other abnormal and inconclusive findings on diagnostic imaging of breast: Secondary | ICD-10-CM

## 2021-08-15 ENCOUNTER — Ambulatory Visit: Payer: BC Managed Care – PPO | Admitting: Podiatry

## 2021-08-21 ENCOUNTER — Other Ambulatory Visit: Payer: Self-pay

## 2021-08-21 ENCOUNTER — Ambulatory Visit: Payer: BC Managed Care – PPO

## 2021-08-21 ENCOUNTER — Ambulatory Visit
Admission: RE | Admit: 2021-08-21 | Discharge: 2021-08-21 | Disposition: A | Discharge status: Home / Self Care | Payer: BC Managed Care – PPO | Source: Ambulatory Visit | Attending: Obstetrics & Gynecology | Admitting: Obstetrics & Gynecology

## 2021-08-21 DIAGNOSIS — R928 Other abnormal and inconclusive findings on diagnostic imaging of breast: Secondary | ICD-10-CM

## 2021-09-12 ENCOUNTER — Ambulatory Visit: Payer: BC Managed Care – PPO | Admitting: Podiatry

## 2021-09-17 ENCOUNTER — Ambulatory Visit: Payer: BC Managed Care – PPO | Admitting: Podiatry

## 2021-09-17 ENCOUNTER — Other Ambulatory Visit: Payer: Self-pay

## 2021-09-17 ENCOUNTER — Encounter: Payer: Self-pay | Admitting: Podiatry

## 2021-09-17 DIAGNOSIS — M722 Plantar fascial fibromatosis: Secondary | ICD-10-CM

## 2021-09-17 DIAGNOSIS — M21869 Other specified acquired deformities of unspecified lower leg: Secondary | ICD-10-CM

## 2021-09-17 MED ORDER — MELOXICAM 15 MG PO TABS: 15.0000 mg | ORAL_TABLET | Freq: Every day | ORAL | 0 refills | Status: AC

## 2021-09-17 MED ORDER — METHYLPREDNISOLONE 4 MG PO TBPK: ORAL_TABLET | ORAL | 0 refills | Status: DC

## 2021-09-19 ENCOUNTER — Ambulatory Visit: Payer: BC Managed Care – PPO | Admitting: Podiatry

## 2021-09-19 ENCOUNTER — Telehealth: Payer: Self-pay | Admitting: *Deleted

## 2021-09-19 NOTE — Telephone Encounter (Signed)
I'm calling to let you know that Dr. Posey Pronto said you could come by to get the braces.  "Are you open tomorrow?"  Yes, but we close at 2:30 pm.  "Okay, I'll come by sometime next week to get them."  I'll leave them at the front desk. ?

## 2021-09-19 NOTE — Telephone Encounter (Signed)
"  I was there on Tuesday.  We talked about me going to [place]. He said that he would give me more straps for the Plantar Fascial Braces because they have stretched so much and they're not working anymore."  We don't have extra straps for the braces.  We'd have to sale you another brace.  "That's not what Dr. Posey Pronto said when I was there.  He said he could just replace the straps.  Can you send him a message and ask him?"  I'll send him your message. ?

## 2021-09-19 NOTE — Progress Notes (Signed)
?Subjective:  ?Patient ID: Leslie Waller, female    DOB: 1971-01-15,  MRN: [id number] ? ?Chief Complaint  ?Patient presents with  ? Plantar Fasciitis  ?  "They're hurting really bad now."  ? ? ?51 y.o. female presents with the above complaint.  Patient presents for follow-up of bilateral Planter fasciitis.  She states that she is doing a little bit better the injection definitely helped some.  She states starting her hurting again.  She would like to discuss next treatment plans. ? ? ?Review of Systems: Negative except as noted in the HPI. Denies N/V/F/Ch. ? ?Past Medical History:  ?Diagnosis Date  ? GERD (gastroesophageal reflux disease)   ? PONV (postoperative nausea and vomiting)   ? ? ?Current Outpatient Medications:  ?  meloxicam (MOBIC) 15 MG tablet, Take 1 tablet (15 mg total) by mouth daily., Disp: 30 tablet, Rfl: 0 ?  methylPREDNISolone (MEDROL DOSEPAK) 4 MG TBPK tablet, Take as directed, Disp: 21 each, Rfl: 0 ?  BLACK ELDERBERRY PO, Take 1 capsule by mouth daily., Disp: , Rfl:  ?  cyclobenzaprine (FLEXERIL) 10 MG tablet, Take 1 tablet (10 mg total) by mouth 3 (three) times daily as needed., Disp: 15 tablet, Rfl: 0 ?  ibuprofen (ADVIL,MOTRIN) 600 MG tablet, Take 1 tablet (600 mg total) by mouth every 8 (eight) hours as needed., Disp: 15 tablet, Rfl: 0 ?  Magnesium 250 MG TABS, Take 250 mg by mouth daily., Disp: , Rfl:  ?  omeprazole (PRILOSEC) 40 MG capsule, Take 40 mg by mouth daily., Disp: , Rfl:  ?  pyridOXINE (VITAMIN B-6) 100 MG tablet, Take 100 mg by mouth daily., Disp: , Rfl:  ? ?Social History  ? ?Tobacco Use  ?Smoking Status Never  ?Smokeless Tobacco Never  ? ? ?Allergies  ?Allergen Reactions  ? Penicillins Hives and Rash  ? ?Objective:  ? ?There were no vitals filed for this visit. ? ?There is no height or weight on file to calculate BMI. ?Constitutional Well developed. ?Well nourished.  ?Vascular Dorsalis pedis pulses palpable bilaterally. ?Posterior tibial pulses palpable  bilaterally. ?Capillary refill normal to all digits.  ?No cyanosis or clubbing noted. ?Pedal hair growth normal.  ?Neurologic Normal speech. ?Oriented to person, place, and time. ?Epicritic sensation to light touch grossly present bilaterally.  ?Dermatologic Nails well groomed and normal in appearance. ?No open wounds. ?No skin lesions.  ?Orthopedic: Normal joint ROM without pain or crepitus bilaterally. ?No visible deformities. ?Tender to palpation at the calcaneal tuber bilaterally. ?No pain with calcaneal squeeze bilaterally. ?Ankle ROM diminished range of motion bilaterally. ?Silfverskiold Test: positive bilaterally.  ? ?Radiographs: Taken and reviewed. No acute fractures or dislocations. No evidence of stress fracture.  Plantar heel spur present. Posterior heel spur present.  ? ?Assessment:  ? ?1. Plantar fasciitis of right foot   ?2. Plantar fasciitis of left foot   ?3. Gastrocnemius equinus, unspecified laterality   ? ? ?Plan:  ?Patient was evaluated and treated and all questions answered. ? ?Plantar Fasciitis, bilaterally with underlying gastrocnemius equinus ?- XR reviewed as above.  ?- Educated on icing and stretching. Instructions given.  ?-Second injection delivered to the plantar fascia as below. ?- DME: Continue using plantar fascial brace ?- Pharmacologic management: None ?-I discussed with her that if there is no improvement we can discuss physical therapy to help address equinus.  If there is no improvement during next clinical visit she may benefit from that. ? ?Procedure: Injection Tendon/Ligament ?Location: Bilateral plantar fascia at the glabrous junction; medial  approach. ?Skin Prep: alcohol ?Injectate: 0.5 cc 0.5% marcaine plain, 0.5 cc of 1% Lidocaine, 0.5 cc kenalog 10. ?Disposition: Patient tolerated procedure well. Injection site dressed with a band-aid. ? ?No follow-ups on file. ?

## 2021-10-15 ENCOUNTER — Ambulatory Visit: Payer: BC Managed Care – PPO | Admitting: Podiatry

## 2021-10-15 DIAGNOSIS — Q666 Other congenital valgus deformities of feet: Secondary | ICD-10-CM | POA: Diagnosis not present

## 2021-10-15 DIAGNOSIS — M722 Plantar fascial fibromatosis: Secondary | ICD-10-CM | POA: Diagnosis not present

## 2021-10-17 ENCOUNTER — Encounter: Payer: Self-pay | Admitting: Podiatry

## 2021-10-17 NOTE — Progress Notes (Signed)
?Subjective:  ?Patient ID: Leslie Waller, female    DOB: October 28, 1970,  MRN: [id number] ? ?Chief Complaint  ?Patient presents with  ? Plantar Fasciitis  ? ? ?51 y.o. female presents with the above complaint.  Patient presents for follow-up bilateral plantar fasciitis.  She states she was doing a lot better then went to [place] and walked a lot for a week and a half and it made it worse.  She would like to know if she can do 1 last injection.  She denies any other acute complaints. ? ?Review of Systems: Negative except as noted in the HPI. Denies N/V/F/Ch. ? ?Past Medical History:  ?Diagnosis Date  ? GERD (gastroesophageal reflux disease)   ? PONV (postoperative nausea and vomiting)   ? ? ?Current Outpatient Medications:  ?  BLACK ELDERBERRY PO, Take 1 capsule by mouth daily., Disp: , Rfl:  ?  cyclobenzaprine (FLEXERIL) 10 MG tablet, Take 1 tablet (10 mg total) by mouth 3 (three) times daily as needed., Disp: 15 tablet, Rfl: 0 ?  ibuprofen (ADVIL,MOTRIN) 600 MG tablet, Take 1 tablet (600 mg total) by mouth every 8 (eight) hours as needed., Disp: 15 tablet, Rfl: 0 ?  Magnesium 250 MG TABS, Take 250 mg by mouth daily., Disp: , Rfl:  ?  meloxicam (MOBIC) 15 MG tablet, Take 1 tablet (15 mg total) by mouth daily., Disp: 30 tablet, Rfl: 0 ?  methylPREDNISolone (MEDROL DOSEPAK) 4 MG TBPK tablet, Take as directed, Disp: 21 each, Rfl: 0 ?  omeprazole (PRILOSEC) 40 MG capsule, Take 40 mg by mouth daily., Disp: , Rfl:  ?  pyridOXINE (VITAMIN B-6) 100 MG tablet, Take 100 mg by mouth daily., Disp: , Rfl:  ? ?Social History  ? ?Tobacco Use  ?Smoking Status Never  ?Smokeless Tobacco Never  ? ? ?Allergies  ?Allergen Reactions  ? Penicillins Hives and Rash  ? ?Objective:  ? ?There were no vitals filed for this visit. ? ?There is no height or weight on file to calculate BMI. ?Constitutional Well developed. ?Well nourished.  ?Vascular Dorsalis pedis pulses palpable bilaterally. ?Posterior tibial pulses palpable bilaterally. ?Capillary  refill normal to all digits.  ?No cyanosis or clubbing noted. ?Pedal hair growth normal.  ?Neurologic Normal speech. ?Oriented to person, place, and time. ?Epicritic sensation to light touch grossly present bilaterally.  ?Dermatologic Nails well groomed and normal in appearance. ?No open wounds. ?No skin lesions.  ?Orthopedic: Normal joint ROM without pain or crepitus bilaterally. ?No visible deformities. ?Tender to palpation at the calcaneal tuber bilaterally. ?No pain with calcaneal squeeze bilaterally. ?Ankle ROM diminished range of motion bilaterally. ?Silfverskiold Test: positive bilaterally.  ? ?Radiographs: Taken and reviewed. No acute fractures or dislocations. No evidence of stress fracture.  Plantar heel spur present. Posterior heel spur present.  ? ?Assessment:  ? ?1. Plantar fasciitis of right foot   ?2. Plantar fasciitis of left foot   ?3. Pes planovalgus   ? ? ? ?Plan:  ?Patient was evaluated and treated and all questions answered. ? ?Plantar Fasciitis, bilaterally with underlying gastrocnemius equinus ?- XR reviewed as above.  ?- Educated on icing and stretching. Instructions given.  ?-Third injection delivered to the plantar fascia as below. ?- DME: Continue using plantar fascial brace ?- Pharmacologic management: None ?-I discussed with her that if there is no improvement we can discuss physical therapy to help address equinus.  If there is no improvement during next clinical visit she may benefit from that. ?-Patient has already obtain orthotics and is functioning  well in them I discussed with her that this will be beneficial for prevention and discussed shoe gear modification as well. ? ?Procedure: Injection Tendon/Ligament ?Location: Bilateral plantar fascia at the glabrous junction; medial approach. ?Skin Prep: alcohol ?Injectate: 0.5 cc 0.5% marcaine plain, 0.5 cc of 1% Lidocaine, 0.5 cc kenalog 10. ?Disposition: Patient tolerated procedure well. Injection site dressed with a band-aid. ? ?No  follow-ups on file. ?

## 2021-11-19 ENCOUNTER — Ambulatory Visit: Payer: BC Managed Care – PPO | Admitting: Podiatry

## 2022-02-16 ENCOUNTER — Other Ambulatory Visit: Payer: Self-pay

## 2022-02-16 ENCOUNTER — Emergency Department: Payer: BC Managed Care – PPO

## 2022-02-16 DIAGNOSIS — R0789 Other chest pain: Secondary | ICD-10-CM | POA: Diagnosis not present

## 2022-02-16 DIAGNOSIS — Z20822 Contact with and (suspected) exposure to covid-19: Secondary | ICD-10-CM | POA: Diagnosis not present

## 2022-02-16 DIAGNOSIS — J029 Acute pharyngitis, unspecified: Secondary | ICD-10-CM | POA: Insufficient documentation

## 2022-02-16 LAB — BASIC METABOLIC PANEL
Anion gap: 8 (ref 5–15)
BUN: 12 mg/dL (ref 6–20)
CO2: 25 mmol/L (ref 22–32)
Calcium: 10.6 mg/dL — ABNORMAL HIGH (ref 8.9–10.3)
Chloride: 104 mmol/L (ref 98–111)
Creatinine, Ser: 0.74 mg/dL (ref 0.44–1.00)
GFR, Estimated: >60 mL/min (ref >60)
Glucose, Bld: 130 mg/dL — ABNORMAL HIGH (ref 70–99)
Potassium: 3.6 mmol/L (ref 3.5–5.1)
Sodium: 137 mmol/L (ref 135–145)

## 2022-02-16 LAB — URINALYSIS, ROUTINE W REFLEX MICROSCOPIC
Bilirubin Urine: NEGATIVE
Glucose, UA: NEGATIVE mg/dL
Hgb urine dipstick: NEGATIVE
Ketones, ur: NEGATIVE mg/dL
Leukocytes,Ua: NEGATIVE
Nitrite: NEGATIVE
Protein, ur: NEGATIVE mg/dL
Specific Gravity, Urine: 1.001 — ABNORMAL LOW (ref 1.005–1.030)
pH: 7 (ref 5.0–8.0)

## 2022-02-16 LAB — CBC
HCT: 38.1 % (ref 36.0–46.0)
Hemoglobin: 12.2 g/dL (ref 12.0–15.0)
MCH: 29.3 pg (ref 26.0–34.0)
MCHC: 32 g/dL (ref 30.0–36.0)
MCV: 91.6 fL (ref 80.0–100.0)
Platelets: 255 10*3/uL (ref 150–400)
RBC: 4.16 MIL/uL (ref 3.87–5.11)
RDW: 14.4 % (ref 11.5–15.5)
WBC: 9.2 10*3/uL (ref 4.0–10.5)
nRBC: 0 % (ref 0.0–0.2)

## 2022-02-16 LAB — RESP PANEL BY RT-PCR (FLU A&B, COVID) ARPGX2
Influenza A by PCR: NEGATIVE
Influenza B by PCR: NEGATIVE
SARS Coronavirus 2 by RT PCR: NEGATIVE

## 2022-02-16 LAB — POC URINE PREG, ED: Preg Test, Ur: NEGATIVE

## 2022-02-16 LAB — TROPONIN I (HIGH SENSITIVITY): Troponin I (High Sensitivity): 7 ng/L (ref <18)

## 2022-02-16 NOTE — ED Triage Notes (Signed)
Ambulatory to triage with c/o chest heaviness since this morning. Now c/o shortness of breath that began apx 1 hour ago.  States she took Nyquil tonight for sore throat.

## 2022-02-17 ENCOUNTER — Emergency Department
Admission: EM | Admit: 2022-02-17 | Discharge: 2022-02-17 | Disposition: A | Discharge status: Home / Self Care | Payer: BC Managed Care – PPO | Attending: Emergency Medicine | Admitting: Emergency Medicine

## 2022-02-17 DIAGNOSIS — R079 Chest pain, unspecified: Secondary | ICD-10-CM

## 2022-02-17 LAB — GROUP A STREP BY PCR: Group A Strep by PCR: NOT DETECTED

## 2022-02-17 LAB — TROPONIN I (HIGH SENSITIVITY): Troponin I (High Sensitivity): 6 ng/L (ref <18)

## 2022-02-17 MED ORDER — ALUM & MAG HYDROXIDE-SIMETH 200-200-20 MG/5ML PO SUSP
30.0000 mL | Freq: Once | ORAL | Status: AC
  Administered 2022-02-17: 30 mL via ORAL
  Filled 2022-02-17: qty 30

## 2022-02-17 NOTE — ED Provider Notes (Signed)
[address] [facility] Provider Note    Event Date/Time   First MD Initiated Contact with Patient 02/17/22 0145     (approximate)   History   Chest Pain   HPI  Leslie Waller is a 51 y.o. female with past medical history of GERD who presents with chest pain.  Patient noted that when she woke up this morning she felt some very mild discomfort in her chest that went away.  Then around 11 PM patient was sitting down when she started having very severe sharp pain in her chest did not radiate lasted for only seconds and then went away.  Since then she is just feeling like she cannot quite get a deep breath and has some residual tightness but no frank pain.  She currently feels like she is having some indigestion did feel some abdominal discomfort after the chest pain have an episode of loose stool.  Denies vomiting diaphoresis.  No history of similar.The patient denies hx of prior DVT/PE, unilateral leg pain/swelling, hormone use, recent surgery, hx of cancer, prolonged immobilization, or hemoptysis.  Has had mild sore throat of the last several days but no nasal discharge cough or fevers    Past Medical History:  Diagnosis Date   GERD (gastroesophageal reflux disease)    PONV (postoperative nausea and vomiting)     Patient Active Problem List   Diagnosis Date Noted   RLQ abdominal pain 11/04/2017   History of PCOS 11/04/2017     Physical Exam  Triage Vital Signs: ED Triage Vitals  Enc Vitals Group     BP 02/16/22 2243 127/78     Pulse Rate 02/16/22 2243 79     Resp 02/16/22 2243 19     Temp 02/16/22 2243 97.7 F (36.5 C)     Temp src --      SpO2 02/16/22 2243 98 %     Weight 02/16/22 2240 250 lb (113.4 kg)     Height 02/16/22 2240 '5\' 5"'$  (1.651 m)     Head Circumference --      Peak Flow --      Pain Score 02/16/22 2240 10     Pain Loc --      Pain Edu? --      Excl. in [place]? --     Most recent vital signs: Vitals:   02/16/22 2243  BP: 127/78  Pulse: 79   Resp: 19  Temp: 97.7 F (36.5 C)  SpO2: 98%     General: Awake, no distress.  CV:  Good peripheral perfusion.  Trace pitting edema bilateral lower extremities Resp:  Normal effort.  Lungs are clear Abd:  No distention.  Nontender throughout Neuro:             Awake, Alert, Oriented x 3  Other:     ED Results / Procedures / Treatments  Labs (all labs ordered are listed, but only abnormal results are displayed) Labs Reviewed  BASIC METABOLIC PANEL - Abnormal; Notable for the following components:      Result Value   Glucose, Bld 130 (*)    Calcium 10.6 (*)    All other components within normal limits  URINALYSIS, ROUTINE W REFLEX MICROSCOPIC - Abnormal; Notable for the following components:   Color, Urine COLORLESS (*)    APPearance CLEAR (*)    Specific Gravity, Urine 1.001 (*)    All other components within normal limits  RESP PANEL BY RT-PCR (FLU A&B, COVID) ARPGX2  GROUP  A STREP BY PCR  CBC  POC URINE PREG, ED  TROPONIN I (HIGH SENSITIVITY)  TROPONIN I (HIGH SENSITIVITY)     EKG  EKG interpreted myself shows normal sinus rhythm with normal axis normal intervals inverted T wave in V3    RADIOLOGY I reviewed and interpreted the CXR which does not show any acute cardiopulmonary process    PROCEDURES:  Critical Care performed: No  Procedures  The patient is on the cardiac monitor to evaluate for evidence of arrhythmia and/or significant heart rate changes.   MEDICATIONS ORDERED IN ED: Medications - No data to display   IMPRESSION / MDM / [place] / ED COURSE  I reviewed the triage vital signs and the nursing notes.                              Patient's presentation is most consistent with acute presentation with potential threat to life or bodily function.  Differential diagnosis includes, but is not limited to, acute coronary syndrome, pulmonary embolism, pleurisy, pneumonia, pneumothorax, myocarditis/pericarditis, esophageal spasm,  GERD, gastritis  The patient is a 51 year old female has history of GERD is otherwise healthy presents with chest pain.  Had some mild discomfort this morning and then had severe episode of chest pain while sitting down that was sharp and lasted for just seconds.  Now just feels like she cannot quite take a deep breath but is not having ongoing pain although does describe some residual tight feeling.  Symptoms are nonexertional.  She has no history of similar.  Does feel some sense of acid reflux type pain currently.  Her chest x-ray does not have any acute process EKG is nonischemic first troponin is negative will repeat.  Rest of her labs including a CBC BMP and pregnancy test are reassuring.  Repeat troponin is negative.  Together with patient's pain being quite atypical and now being pain-free with 2 negative troponins I think she is appropriate for outpatient follow-up.     FINAL CLINICAL IMPRESSION(S) / ED DIAGNOSES   Final diagnoses:  None     Rx / DC Orders   ED Discharge Orders     None        Note:  This document was prepared using Dragon voice recognition software and may include unintentional dictation errors.   Rada Hay, MD 02/17/22 [phone]

## 2022-02-17 NOTE — Discharge Instructions (Signed)
Your blood work including your cardiac enzymes was reassuring.  If you have recurrent severe chest pain that is not improving please return to emergency department.  Otherwise please follow-up with your primary care provider for further evaluation.

## 2022-07-07 ENCOUNTER — Ambulatory Visit: Admit: 2022-07-07 | Payer: BC Managed Care – PPO

## 2022-07-10 ENCOUNTER — Other Ambulatory Visit: Payer: Self-pay | Admitting: Nurse Practitioner

## 2022-07-10 ENCOUNTER — Ambulatory Visit
Admission: RE | Admit: 2022-07-10 | Discharge: 2022-07-10 | Disposition: A | Discharge status: Home / Self Care | Payer: BC Managed Care – PPO | Source: Ambulatory Visit | Attending: Nurse Practitioner | Admitting: Nurse Practitioner

## 2022-07-10 DIAGNOSIS — R10821 Right upper quadrant rebound abdominal tenderness: Secondary | ICD-10-CM

## 2022-07-16 ENCOUNTER — Other Ambulatory Visit: Payer: Self-pay | Admitting: Nurse Practitioner

## 2022-07-16 DIAGNOSIS — R110 Nausea: Secondary | ICD-10-CM

## 2022-07-16 DIAGNOSIS — K219 Gastro-esophageal reflux disease without esophagitis: Secondary | ICD-10-CM

## 2022-07-16 DIAGNOSIS — R10821 Right upper quadrant rebound abdominal tenderness: Secondary | ICD-10-CM

## 2022-07-22 ENCOUNTER — Ambulatory Visit
Admission: RE | Admit: 2022-07-22 | Discharge: 2022-07-22 | Disposition: A | Discharge status: Home / Self Care | Payer: BC Managed Care – PPO | Source: Ambulatory Visit | Attending: Nurse Practitioner | Admitting: Nurse Practitioner

## 2022-07-22 DIAGNOSIS — R10821 Right upper quadrant rebound abdominal tenderness: Secondary | ICD-10-CM | POA: Diagnosis not present

## 2022-07-22 DIAGNOSIS — K219 Gastro-esophageal reflux disease without esophagitis: Secondary | ICD-10-CM | POA: Diagnosis present

## 2022-07-22 DIAGNOSIS — R110 Nausea: Secondary | ICD-10-CM | POA: Insufficient documentation

## 2022-07-22 MED ORDER — TECHNETIUM TC 99M MEBROFENIN IV KIT
5.0000 | PACK | Freq: Once | INTRAVENOUS | Status: AC | PRN
  Administered 2022-07-22: 4.96 via INTRAVENOUS

## 2022-08-21 ENCOUNTER — Ambulatory Visit
Admission: RE | Admit: 2022-08-21 | Discharge: 2022-08-21 | Disposition: A | Discharge status: Home / Self Care | Payer: BC Managed Care – PPO | Source: Ambulatory Visit | Attending: Internal Medicine | Admitting: Internal Medicine

## 2022-08-21 VITALS — BP 165/83 | HR 81 | Temp 99.8°F | Resp 18

## 2022-08-21 DIAGNOSIS — R030 Elevated blood-pressure reading, without diagnosis of hypertension: Secondary | ICD-10-CM | POA: Insufficient documentation

## 2022-08-21 DIAGNOSIS — J029 Acute pharyngitis, unspecified: Secondary | ICD-10-CM | POA: Diagnosis present

## 2022-08-21 DIAGNOSIS — R6889 Other general symptoms and signs: Secondary | ICD-10-CM

## 2022-08-21 DIAGNOSIS — U071 COVID-19: Secondary | ICD-10-CM | POA: Insufficient documentation

## 2022-08-21 LAB — POCT INFLUENZA A/B
Influenza A, POC: NEGATIVE
Influenza B, POC: NEGATIVE

## 2022-08-21 LAB — POCT RAPID STREP A (OFFICE): Rapid Strep A Screen: NEGATIVE

## 2022-08-21 LAB — SARS CORONAVIRUS 2 (TAT 6-24 HRS): SARS Coronavirus 2: POSITIVE — AB

## 2022-08-21 MED ORDER — BENZONATATE 100 MG PO CAPS: ORAL_CAPSULE | ORAL | 0 refills | Status: DC

## 2022-08-21 NOTE — Discharge Instructions (Addendum)
You have been diagnosed with a viral upper respiratory infection based on your symptoms and exam. Viral illnesses cannot be treated with antibiotics - they are self limiting - and you should find your symptoms resolving within a few days. Get plenty of rest and non-caffeinated fluids. Watch for signs of dehydration including reduced urine output and dark colored urine.  We have performed a respiratory swab testing for COVID, and influenza. You are negative for influenza. If the results of this COVID testing are positive, someone will call you if you are eligible for any antiviral treatment.    We recommend you use over-the-counter medications for symptom control including acetaminophen (Tylenol), ibuprofen (Advil/Motrin) or naproxen (Aleve) for throat pain, fever, chills or body aches. You may combine use of acetaminophen and ibuprofen/naproxen if needed.  Some patients find an pain-relieving throat spray such as Chloraseptic to be effective.  Also recommend cold/cough medication containing a cough suppressant such as dextromethorphan, as needed. Please note that some cough medications are not recommended if you suffer from hypertension.    Saline mist spray is helpful for removing excess mucus from your nose.  Room humidifiers are helpful to ease breathing at night. I recommend guaifenesin (Mucinex) with plenty of water throughout the day to help thin and loosen mucus secretions in your respiratory passages.   If appropriate based upon your other medical problems, you might also find relief of nasal/sinus congestion symptoms by using a nasal decongestant such as fluticasone (Flonase ) or pseudoephedrine (Sudafed sinus).  You will need to obtain Sudafed from behind the pharmacist counter.  Speak to the pharmacist to verify that you are not duplicating medications with other over-the-counter formulations that you may be using.

## 2022-08-21 NOTE — ED Provider Notes (Signed)
[facility]    CSN: [id number] Arrival date & time: 08/21/22  1132      History   Chief Complaint Chief Complaint  Patient presents with   Sore Throat    Entered by patient   Generalized Body Aches   Chills   Fever    HPI Leslie Waller is a 52 y.o. female.    Sore Throat  Fever   Presents to urgent care with complaint of sore throat, fever (Tmax 100 F), body aches, chills starting yesterday.  Also endorses left otalgia.  She endorses some respiratory congestion in her head and chest.  Some cough, unproductive.  Past Medical History:  Diagnosis Date   GERD (gastroesophageal reflux disease)    PONV (postoperative nausea and vomiting)     Patient Active Problem List   Diagnosis Date Noted   RLQ abdominal pain 11/04/2017   History of PCOS 11/04/2017    Past Surgical History:  Procedure Laterality Date   BREAST BIOPSY Right 06/01/2020   Procedure: BREAST BIOPSY WITH NEEDLE LOCALIZATION;  Surgeon: Robert Bellow, MD;  Location: [facility] ORS;  Service: General;  Laterality: Right;   BREAST CYST ASPIRATION Right 07/30/2016   TONSILLECTOMY      OB History     Gravida  0   Para  0   Term  0   Preterm  0   AB  0   Living  0      SAB  0   IAB  0   Ectopic  0   Multiple  0   Live Births  0            Home Medications    Prior to Admission medications   Medication Sig Start Date End Date Taking? Authorizing Provider  BLACK ELDERBERRY PO Take 1 capsule by mouth daily.    [provider]  cyclobenzaprine (FLEXERIL) 10 MG tablet Take 1 tablet (10 mg total) by mouth 3 (three) times daily as needed. 08/09/18   Sable Feil, PA-C  ibuprofen (ADVIL,MOTRIN) 600 MG tablet Take 1 tablet (600 mg total) by mouth every 8 (eight) hours as needed. 08/09/18   Sable Feil, PA-C  Magnesium 250 MG TABS Take 250 mg by mouth daily.    [provider]  meloxicam (MOBIC) 15 MG tablet Take 1 tablet (15 mg total) by mouth daily.  09/17/21   Felipa Furnace, DPM  methylPREDNISolone (MEDROL DOSEPAK) 4 MG TBPK tablet Take as directed 09/17/21   Felipa Furnace, DPM  omeprazole (PRILOSEC) 40 MG capsule Take 40 mg by mouth daily.    [provider]  pyridOXINE (VITAMIN B-6) 100 MG tablet Take 100 mg by mouth daily.    [provider]    Family History History reviewed. No pertinent family history.  Social History Social History   Tobacco Use   Smoking status: Never   Smokeless tobacco: Never  Vaping Use   Vaping Use: Never used  Substance Use Topics   Alcohol use: Yes    Comment: occ   Drug use: Never     Allergies   Penicillins   Review of Systems Review of Systems  Constitutional:  Positive for fever.     Physical Exam Triage Vital Signs ED Triage Vitals  Enc Vitals Group     BP 08/21/22 1249 (!) 165/83     Pulse Rate 08/21/22 1249 81     Resp 08/21/22 1249 18     Temp 08/21/22 1249  99.8 F (37.7 C)     Temp Source 08/21/22 1249 Oral     SpO2 08/21/22 1249 95 %     Weight --      Height --      Head Circumference --      Peak Flow --      Pain Score 08/21/22 1248 4     Pain Loc --      Pain Edu? --      Excl. in [place]? --    No data found.  Updated Vital Signs BP (!) 165/83 (BP Location: Left Arm)   Pulse 81   Temp 99.8 F (37.7 C) (Oral)   Resp 18   LMP 08/05/2022 (Approximate)   SpO2 95%   Visual Acuity Right Eye Distance:   Left Eye Distance:   Bilateral Distance:    Right Eye Near:   Left Eye Near:    Bilateral Near:     Physical Exam Constitutional:      Appearance: She is well-developed. She is ill-appearing.  Skin:    General: Skin is warm and dry.  Neurological:     General: No focal deficit present.     Mental Status: She is alert and oriented to person, place, and time.  Psychiatric:        Mood and Affect: Mood normal.        Behavior: Behavior normal.      UC Treatments / Results  Labs (all labs ordered are listed, but only abnormal  results are displayed) Labs Reviewed  POCT RAPID STREP A (OFFICE)    EKG   Radiology No results found.  Procedures Procedures (including critical care time)  Medications Ordered in UC Medications - No data to display  Initial Impression / Assessment and Plan / UC Course  I have reviewed the triage vital signs and the nursing notes.  Pertinent labs & imaging results that were available during my care of the patient were reviewed by me and considered in my medical decision making (see chart for details).   Patient is afebrile here without recent antipyretics. Satting well on room air. Overall is wllell appearing, well hydrated, without respiratory distress. Pulmonary exam is unremarkable.  Lungs CTAB without wheezing, rhonchi, rales.  TMs are WNL bilaterally.  Pharyngeal erythema is present but without peritonsillar exudates.  Patient's symptoms are consistent with an acute viral process including influenza and COVID.  POCT influenza is negative.  COVID testing is pending.  Otherwise recommending use of OTC medication for symptom control.   Final Clinical Impressions(s) / UC Diagnoses   Final diagnoses:  None   Discharge Instructions   None    ED Prescriptions   None    PDMP not reviewed this encounter.   Rose Phi, Ashley 08/21/22 1342

## 2022-08-21 NOTE — ED Triage Notes (Signed)
Pt c/o sore throat, fever (100F),body aches, and chills that began yesterday. The patient states today her left ear has been bothering her.  Home interventions: nyquil, advil

## 2022-11-23 ENCOUNTER — Emergency Department
Admission: EM | Admit: 2022-11-23 | Discharge: 2022-11-23 | Disposition: A | Discharge status: Home / Self Care | Payer: BC Managed Care – PPO | Attending: Student in an Organized Health Care Education/Training Program | Admitting: Student in an Organized Health Care Education/Training Program

## 2022-11-23 ENCOUNTER — Other Ambulatory Visit: Payer: Self-pay

## 2022-11-23 ENCOUNTER — Emergency Department: Payer: BC Managed Care – PPO

## 2022-11-23 DIAGNOSIS — R0789 Other chest pain: Secondary | ICD-10-CM | POA: Diagnosis present

## 2022-11-23 DIAGNOSIS — R002 Palpitations: Secondary | ICD-10-CM | POA: Insufficient documentation

## 2022-11-23 LAB — CBC
HCT: 37.3 % (ref 36.0–46.0)
Hemoglobin: 11.8 g/dL — ABNORMAL LOW (ref 12.0–15.0)
MCH: 28.6 pg (ref 26.0–34.0)
MCHC: 31.6 g/dL (ref 30.0–36.0)
MCV: 90.3 fL (ref 80.0–100.0)
Platelets: 280 10*3/uL (ref 150–400)
RBC: 4.13 MIL/uL (ref 3.87–5.11)
RDW: 14.2 % (ref 11.5–15.5)
WBC: 8.6 10*3/uL (ref 4.0–10.5)
nRBC: 0 % (ref 0.0–0.2)

## 2022-11-23 LAB — BASIC METABOLIC PANEL
Anion gap: 8 (ref 5–15)
BUN: 9 mg/dL (ref 6–20)
CO2: 27 mmol/L (ref 22–32)
Calcium: 11.2 mg/dL — ABNORMAL HIGH (ref 8.9–10.3)
Chloride: 101 mmol/L (ref 98–111)
Creatinine, Ser: 0.58 mg/dL (ref 0.44–1.00)
GFR, Estimated: >60 mL/min (ref >60)
Glucose, Bld: 153 mg/dL — ABNORMAL HIGH (ref 70–99)
Potassium: 3.5 mmol/L (ref 3.5–5.1)
Sodium: 136 mmol/L (ref 135–145)

## 2022-11-23 LAB — TROPONIN I (HIGH SENSITIVITY)
Troponin I (High Sensitivity): 7 ng/L (ref <18)
Troponin I (High Sensitivity): 8 ng/L (ref <18)

## 2022-11-23 LAB — D-DIMER, QUANTITATIVE: D-Dimer, Quant: <0.27 ug/mL-FEU (ref 0.00–0.50)

## 2022-11-23 NOTE — ED Notes (Signed)
Two unsuccssful IV by this Clinical research associate. Labs were obtained and sent. Dr. Roxan Hockey aware.

## 2022-11-23 NOTE — ED Triage Notes (Signed)
Reports was sitting in the chair and all the sudden had some pain to her mid to left chest. Pt reports pain shoots through to her back left shoulder area. Pt reports her HR dropped and then when she stood up it shot up into the 150's. Pt reports the pain was shooting and now she feels heavy and tight in her chest.

## 2022-11-23 NOTE — ED Provider Notes (Signed)
[facility] Provider Note    Event Date/Time   First MD Initiated Contact with Patient 11/23/22 1356     (approximate)   History   Chest Pain   HPI  Leslie Waller is a 52 y.o. female who presents to the ER for evaluation of chest pain and palpitations that occurred this morning while patient was sitting watching TV.  Felt a discomfort radiate from the left side of her chest to the center.  Also felt palpitations and her Apple Watch that her heart rate was 150.  She got up and walked around the house with her daughter and her heart rate did come down.  Still feels a tightness and discomfort but overall significantly improved.  Does have a history of reflux on Prilosec as well as Tums.  Denies any abdominal pain.  No fevers no chills.     Physical Exam   Triage Vital Signs: ED Triage Vitals  Enc Vitals Group     BP 11/23/22 1311 (!) 161/98     Pulse Rate 11/23/22 1311 77     Resp 11/23/22 1311 20     Temp 11/23/22 1311 97.7 F (36.5 C)     Temp Source 11/23/22 1311 Oral     SpO2 11/23/22 1311 97 %     Weight 11/23/22 1312 270 lb (122.5 kg)     Height 11/23/22 1312 5\' 5"  (1.651 m)     Head Circumference --      Peak Flow --      Pain Score 11/23/22 1312 6     Pain Loc --      Pain Edu? --      Excl. in [place]? --     Most recent vital signs: Vitals:   11/23/22 1311  BP: (!) 161/98  Pulse: 77  Resp: 20  Temp: 97.7 F (36.5 C)  SpO2: 97%     Constitutional: Alert  Eyes: Conjunctivae are normal.  Head: Atraumatic. Nose: No congestion/rhinnorhea. Mouth/Throat: Mucous membranes are moist.   Neck: Painless ROM.  Cardiovascular:   Good peripheral circulation. Respiratory: Normal respiratory effort.  No retractions.  Gastrointestinal: Soft and nontender.  Musculoskeletal:  no deformity Neurologic:  MAE spontaneously. No gross focal neurologic deficits are appreciated.  Skin:  Skin is warm, dry and intact. No rash noted. Psychiatric: Mood and  affect are normal. Speech and behavior are normal.    ED Results / Procedures / Treatments   Labs (all labs ordered are listed, but only abnormal results are displayed) Labs Reviewed  BASIC METABOLIC PANEL - Abnormal; Notable for the following components:      Result Value   Glucose, Bld 153 (*)    Calcium 11.2 (*)    All other components within normal limits  CBC - Abnormal; Notable for the following components:   Hemoglobin 11.8 (*)    All other components within normal limits  D-DIMER, QUANTITATIVE  POC URINE PREG, ED  TROPONIN I (HIGH SENSITIVITY)  TROPONIN I (HIGH SENSITIVITY)     EKG  ED ECG REPORT I, Willy Eddy, the attending physician, personally viewed and interpreted this ECG.   Date: 11/23/2022  EKG Time: 13:05  Rate: 70  Rhythm: sinus  Axis: normal  Intervals: normal   ST&T Change: no stemi, occasional pvc    RADIOLOGY Please see ED Course for my review and interpretation.  I personally reviewed all radiographic images ordered to evaluate for the above acute complaints and reviewed radiology reports and findings.  These findings were personally discussed with the patient.  Please see medical record for radiology report.    PROCEDURES:  Critical Care performed: No  Procedures   MEDICATIONS ORDERED IN ED: Medications - No data to display   IMPRESSION / MDM / ASSESSMENT AND PLAN / ED COURSE  I reviewed the triage vital signs and the nursing notes.                              Differential diagnosis includes, but is not limited to, ACS, pericarditis, esophagitis, boerhaaves, pe, dissection, pna, bronchitis, costochondritis  Patient presenting to the ER for evaluation of symptoms as described above.  Based on symptoms, risk factors and considered above differential, this presenting complaint could reflect a potentially life-threatening illness therefore the patient will be placed on continuous pulse oximetry and telemetry for monitoring.   Laboratory evaluation will be sent to evaluate for the above complaints.      Clinical Course as of 11/23/22 1619  Sun Nov 23, 2022  1619 Repeat troponin negative.  D-dimer negative.  Patient remains well-appearing in no acute distress.  Does appear stable and appropriate for outpatient follow-up. [PR]    Clinical Course User Index [PR] Willy Eddy, MD     FINAL CLINICAL IMPRESSION(S) / ED DIAGNOSES   Final diagnoses:  Atypical chest pain     Rx / DC Orders   ED Discharge Orders          Ordered    Ambulatory referral to Cardiology       Comments: If you have not heard from the Cardiology office within the next 72 hours please call [phone].   11/23/22 1615             Note:  This document was prepared using Dragon voice recognition software and may include unintentional dictation errors.    Willy Eddy, MD 11/23/22 [phone]

## 2022-12-07 IMAGING — MG MM DIGITAL DIAGNOSTIC UNILAT*R* W/ TOMO W/ CAD
6 series · 6 of 18 positions shown · non-contrast
Comparison: Previous exam(s).

CLINICAL DATA: Patient was recalled from screening mammogram for
possible asymmetries in the right breast.

EXAM:
DIGITAL DIAGNOSTIC UNILATERAL RIGHT MAMMOGRAM WITH TOMOSYNTHESIS AND
CAD
TECHNIQUE: Right digital diagnostic mammography and breast tomosynthesis was
performed. The images were evaluated with computer-aided detection.

[R CC synth-2D]
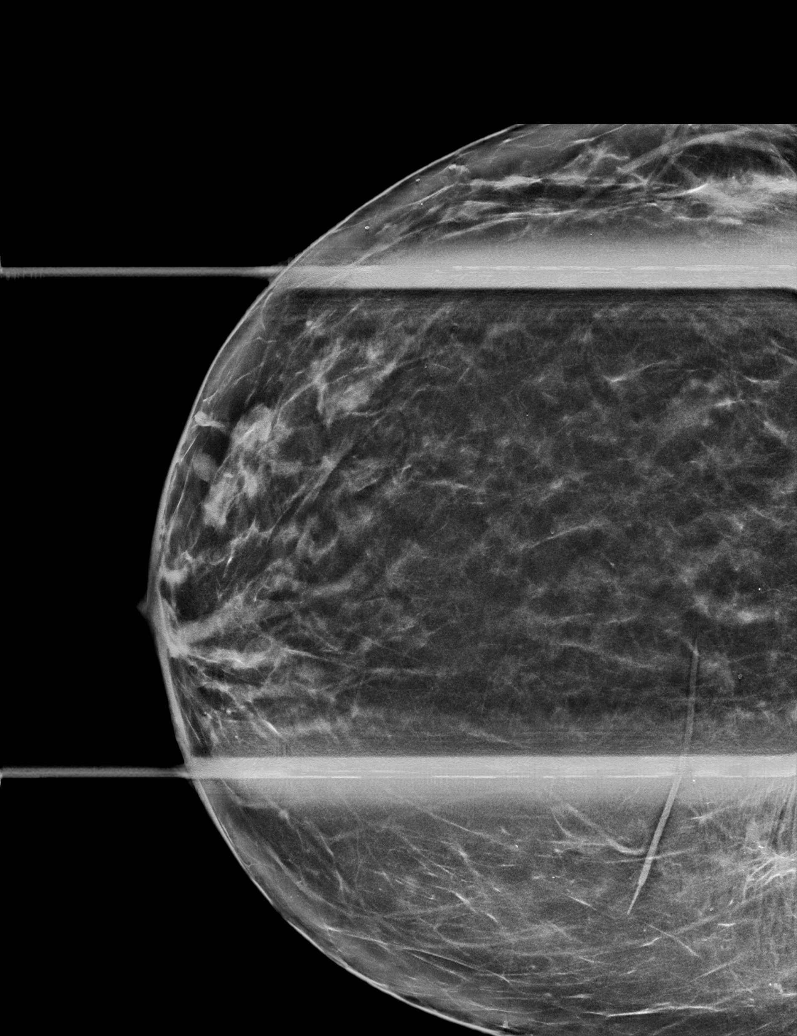

[R MLO synth-2D]
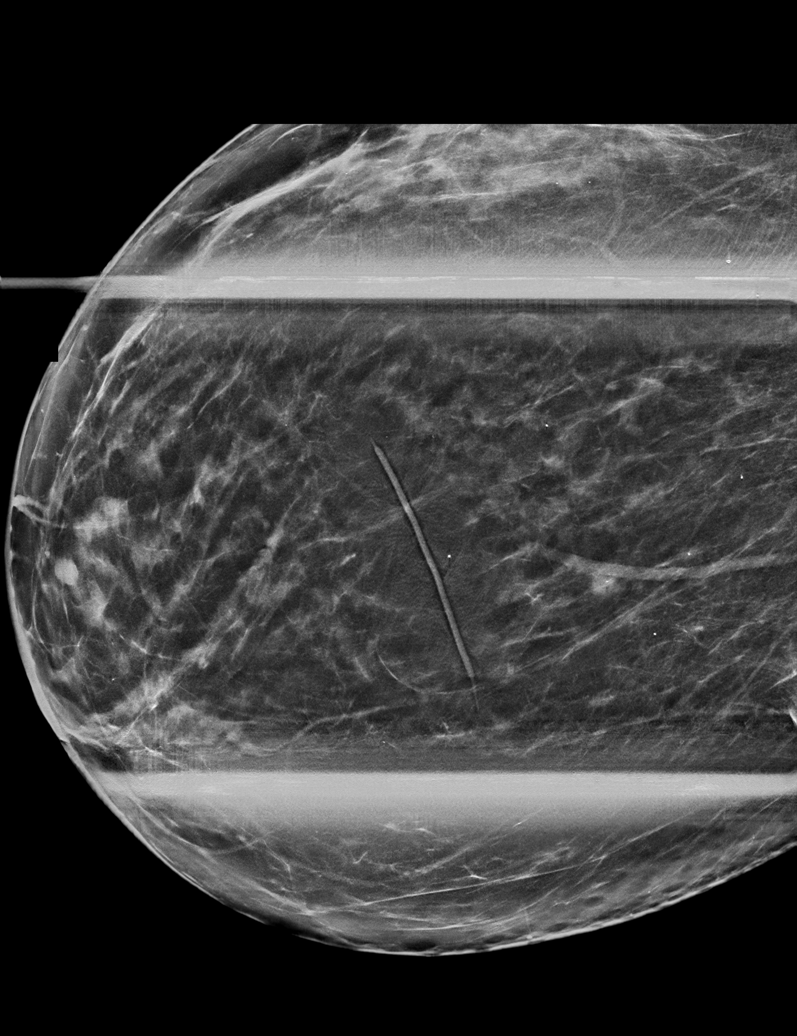

[R ML synth-2D]
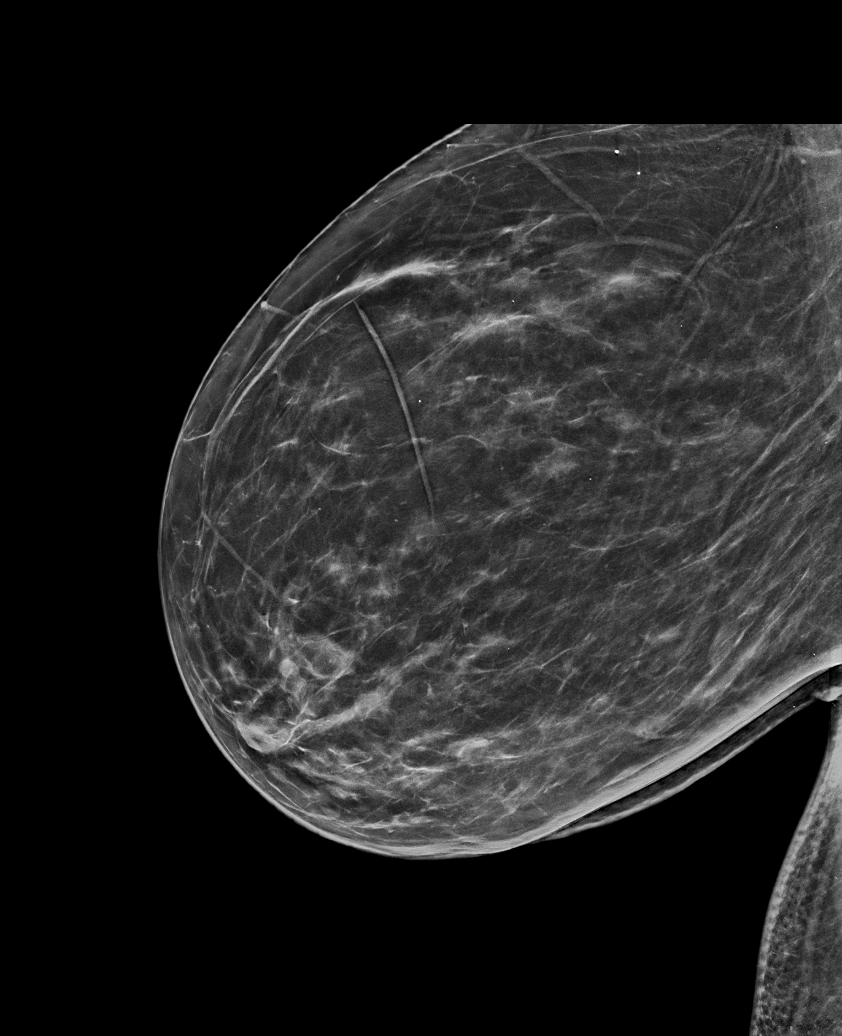

[R MLO tomo · tomo slice 37/74.0]
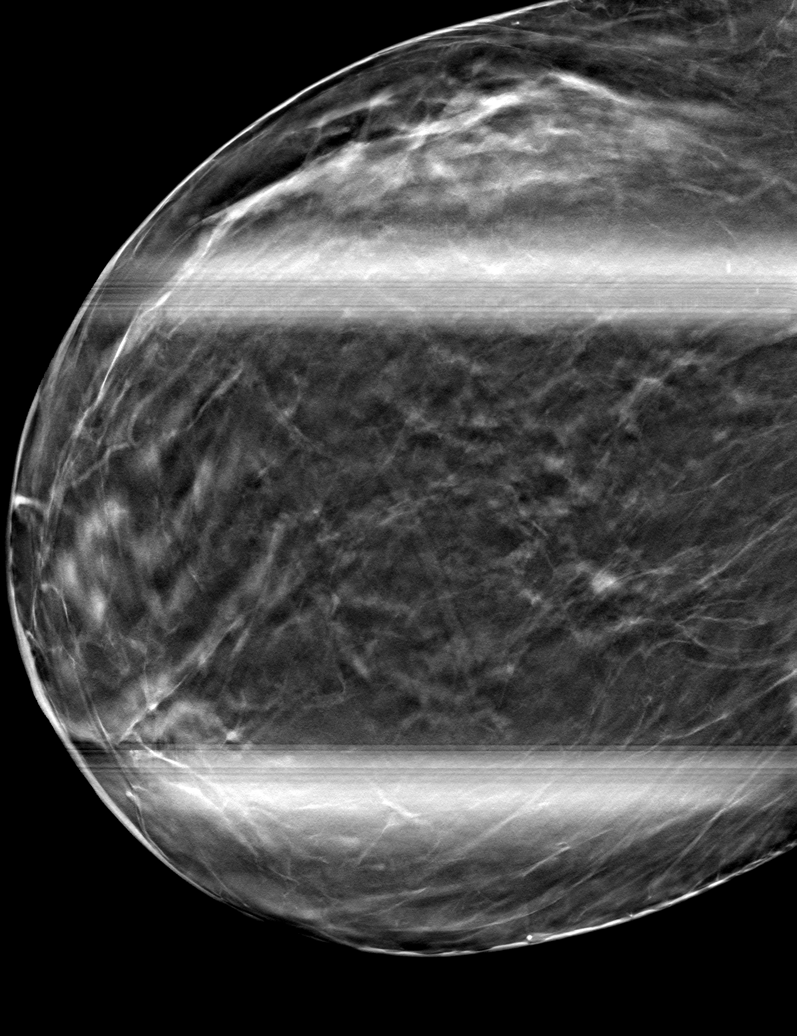

[R ML tomo · tomo slice 39/77.0]
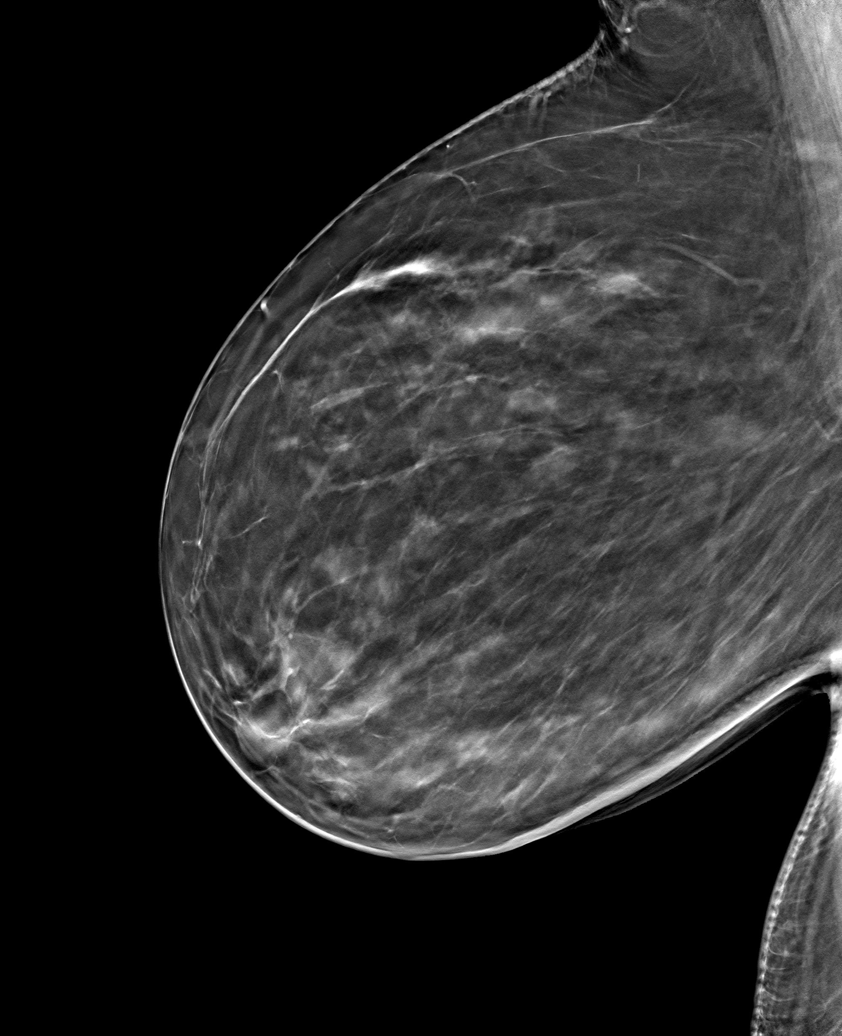

[R CC tomo · tomo slice 34/67.0]
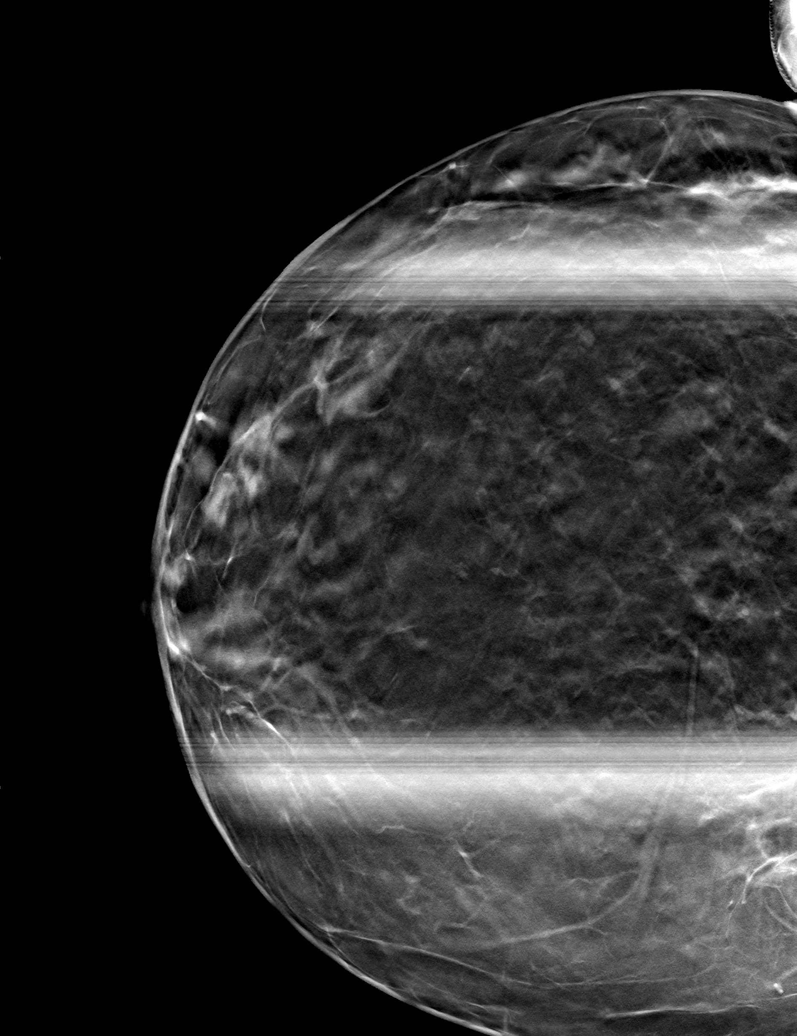

[6 of 18 positions shown; findings below may reference images not displayed]

ACR Breast Density Category b: There are scattered areas of
fibroglandular density.
FINDINGS: Additional imaging of the right breast was performed. No suspicious
mass or malignant type microcalcifications identified.
IMPRESSION: No evidence of malignancy in the right breast.

RECOMMENDATION:
Bilateral screening mammogram in Thursday June, 2022 is recommended.

I have discussed the findings and recommendations with the patient.
If applicable, a reminder letter will be sent to the patient
regarding the next appointment.

BI-RADS CATEGORY  1: Negative.

## 2023-01-13 ENCOUNTER — Encounter
Admission: RE | Disposition: A | Discharge status: Home / Self Care | Payer: Self-pay | Source: Home / Self Care | Attending: Gastroenterology

## 2023-01-13 ENCOUNTER — Ambulatory Visit
Admission: RE | Admit: 2023-01-13 | Discharge: 2023-01-13 | Disposition: A | Discharge status: Home / Self Care | Payer: BC Managed Care – PPO | Attending: Gastroenterology | Admitting: Gastroenterology

## 2023-01-13 ENCOUNTER — Ambulatory Visit: Payer: BC Managed Care – PPO | Admitting: Anesthesiology

## 2023-01-13 ENCOUNTER — Encounter: Payer: Self-pay | Admitting: *Deleted

## 2023-01-13 DIAGNOSIS — Z1211 Encounter for screening for malignant neoplasm of colon: Secondary | ICD-10-CM | POA: Insufficient documentation

## 2023-01-13 DIAGNOSIS — K2970 Gastritis, unspecified, without bleeding: Secondary | ICD-10-CM | POA: Insufficient documentation

## 2023-01-13 DIAGNOSIS — K640 First degree hemorrhoids: Secondary | ICD-10-CM | POA: Diagnosis not present

## 2023-01-13 DIAGNOSIS — K317 Polyp of stomach and duodenum: Secondary | ICD-10-CM | POA: Diagnosis not present

## 2023-01-13 HISTORY — PX: BIOPSY: SHX5522

## 2023-01-13 HISTORY — PX: POLYPECTOMY: SHX5525

## 2023-01-13 HISTORY — PX: ESOPHAGOGASTRODUODENOSCOPY (EGD) WITH PROPOFOL: SHX5813

## 2023-01-13 HISTORY — PX: COLONOSCOPY WITH PROPOFOL: SHX5780

## 2023-01-13 LAB — POCT PREGNANCY, URINE: Preg Test, Ur: NEGATIVE

## 2023-01-13 SURGERY — COLONOSCOPY WITH PROPOFOL
Anesthesia: General

## 2023-01-13 MED ORDER — LIDOCAINE HCL (PF) 2 % IJ SOLN
INTRAMUSCULAR | Status: AC
  Filled 2023-01-13: qty 5

## 2023-01-13 MED ORDER — SODIUM CHLORIDE 0.9 % IV SOLN
INTRAVENOUS | Status: DC
  Administered 2023-01-13: 1000 mL via INTRAVENOUS

## 2023-01-13 MED ORDER — DEXMEDETOMIDINE HCL IN NACL 80 MCG/20ML IV SOLN
INTRAVENOUS | Status: DC | PRN
  Administered 2023-01-13: 10 ug via INTRAVENOUS

## 2023-01-13 MED ORDER — LIDOCAINE HCL (CARDIAC) PF 100 MG/5ML IV SOSY
PREFILLED_SYRINGE | INTRAVENOUS | Status: DC | PRN
  Administered 2023-01-13: 100 mg via INTRAVENOUS

## 2023-01-13 MED ORDER — PROPOFOL 1000 MG/100ML IV EMUL
INTRAVENOUS | Status: AC
  Filled 2023-01-13: qty 100

## 2023-01-13 MED ORDER — PROPOFOL 500 MG/50ML IV EMUL
INTRAVENOUS | Status: DC | PRN
  Administered 2023-01-13: 125 ug/kg/min via INTRAVENOUS

## 2023-01-13 MED ORDER — PROPOFOL 10 MG/ML IV BOLUS
INTRAVENOUS | Status: DC | PRN
  Administered 2023-01-13: 50 mg via INTRAVENOUS
  Administered 2023-01-13: 100 mg via INTRAVENOUS
  Administered 2023-01-13: 70 mg via INTRAVENOUS
  Administered 2023-01-13 (×6): 50 mg via INTRAVENOUS
  Administered 2023-01-13: 70 mg via INTRAVENOUS

## 2023-01-13 MED ORDER — GLYCOPYRROLATE 0.2 MG/ML IJ SOLN
INTRAMUSCULAR | Status: AC
  Filled 2023-01-13: qty 1

## 2023-01-13 MED ORDER — GLYCOPYRROLATE 0.2 MG/ML IJ SOLN
INTRAMUSCULAR | Status: DC | PRN
  Administered 2023-01-13: .2 mg via INTRAVENOUS

## 2023-01-13 NOTE — Op Note (Signed)
[facility] Gastroenterology Patient Name: Leslie Waller Procedure Date: 01/13/2023 7:30 AM MRN: [id number] Account #: [id number] Date of Birth: Jul 18, 1970 Admit Type: Outpatient Age: 52 Room:  [facility] ENDO ROOM 1 Gender: Female Note Status: Finalized Instrument Name: Prentice Docker [id number] Procedure:             Colonoscopy Indications:           Screening for colorectal malignant neoplasm Providers:             Eather Colas MD, MD Medicines:             Monitored Anesthesia Care Complications:         No immediate complications. Estimated blood loss:                         Minimal. Procedure:             Pre-Anesthesia Assessment:                        - Prior to the procedure, a History and Physical was                         performed, and patient medications and allergies were                         reviewed. The patient is competent. The risks and                         benefits of the procedure and the sedation options and                         risks were discussed with the patient. All questions                         were answered and informed consent was obtained.                         Patient identification and proposed procedure were                         verified by the physician, the nurse, the                         anesthesiologist, the anesthetist and the technician                         in the endoscopy suite. Mental Status Examination:                         alert and oriented. Airway Examination: normal                         oropharyngeal airway and neck mobility. Respiratory                         Examination: clear to auscultation. CV Examination:                         normal. Prophylactic Antibiotics: The patient does not  require prophylactic antibiotics. Prior                         Anticoagulants: The patient has taken no anticoagulant                         or antiplatelet agents. ASA Grade  Assessment: III - A                         patient with severe systemic disease. After reviewing                         the risks and benefits, the patient was deemed in                         satisfactory condition to undergo the procedure. The                         anesthesia plan was to use monitored anesthesia care                         (MAC). Immediately prior to administration of                         medications, the patient was re-assessed for adequacy                         to receive sedatives. The heart rate, respiratory                         rate, oxygen saturations, blood pressure, adequacy of                         pulmonary ventilation, and response to care were                         monitored throughout the procedure. The physical                         status of the patient was re-assessed after the                         procedure.                        After obtaining informed consent, the colonoscope was                         passed under direct vision. Throughout the procedure,                         the patient's blood pressure, pulse, and oxygen                         saturations were monitored continuously. The                         Colonoscope was introduced through the anus and  advanced to the the cecum, identified by appendiceal                         orifice and ileocecal valve. The colonoscopy was                         performed without difficulty. The patient tolerated                         the procedure well. The quality of the bowel                         preparation was good. The ileocecal valve, appendiceal                         orifice, and rectum were photographed. Findings:      The perianal and digital rectal examinations were normal.      A 3 mm polyp was found in the ileocecal valve. The polyp was sessile.       The polyp was removed with a cold snare. Resection and retrieval were        complete. Estimated blood loss was minimal.      Internal hemorrhoids were found during retroflexion. The hemorrhoids       were Grade I (internal hemorrhoids that do not prolapse).      The exam was otherwise without abnormality on direct and retroflexion       views. Impression:            - One 3 mm polyp at the ileocecal valve, removed with                         a cold snare. Resected and retrieved.                        - Internal hemorrhoids.                        - The examination was otherwise normal on direct and                         retroflexion views. Recommendation:        - Discharge patient to home.                        - Resume previous diet.                        - Continue present medications.                        - Await pathology results.                        - Repeat colonoscopy for surveillance based on                         pathology results.                        - Return to referring physician as previously  scheduled. Procedure Code(s):     --- Professional ---                        [phone], Colonoscopy, flexible; with removal of                         tumor(s), polyp(s), or other lesion(s) by snare                         technique Diagnosis Code(s):     --- Professional ---                        Z12.11, Encounter for screening for malignant neoplasm                         of colon                        D12.0, Benign neoplasm of cecum                        K64.0, First degree hemorrhoids CPT copyright 2022 [employer]. All rights reserved. The codes documented in this report are preliminary and upon coder review may  be revised to meet current compliance requirements. Eather Colas MD, MD 01/13/2023 8:27:35 AM Number of Addenda: 0 Note Initiated On: 01/13/2023 7:30 AM Scope Withdrawal Time: 0 hours 9 minutes 0 seconds  Total Procedure Duration: 0 hours 16 minutes 15 seconds  Estimated Blood  Loss:  Estimated blood loss was minimal.      [facility]

## 2023-01-13 NOTE — Op Note (Signed)
[facility] Gastroenterology Patient Name: Leslie Waller Procedure Date: 01/13/2023 7:30 AM MRN: [id number] Account #: [id number] Date of Birth: Oct 03, 1970 Admit Type: Outpatient Age: 52 Room: [facility] ENDO ROOM 1 Gender: Female Note Status: Finalized Instrument Name: Upper Endoscope [id number] Procedure:             Upper GI endoscopy Indications:           Upper abdominal pain, Dyspepsia Providers:             Eather Colas MD, MD Medicines:             Monitored Anesthesia Care Complications:         No immediate complications. Estimated blood loss:                         Minimal. Procedure:             Pre-Anesthesia Assessment:                        - Prior to the procedure, a History and Physical was                         performed, and patient medications and allergies were                         reviewed. The patient is competent. The risks and                         benefits of the procedure and the sedation options and                         risks were discussed with the patient. All questions                         were answered and informed consent was obtained.                         Patient identification and proposed procedure were                         verified by the physician, the nurse, the                         anesthesiologist, the anesthetist and the technician                         in the endoscopy suite. Mental Status Examination:                         alert and oriented. Airway Examination: normal                         oropharyngeal airway and neck mobility. Respiratory                         Examination: clear to auscultation. CV Examination:                         normal. Prophylactic Antibiotics: The patient does not  require prophylactic antibiotics. Prior                         Anticoagulants: The patient has taken no anticoagulant                         or antiplatelet agents. ASA Grade  Assessment: III - A                         patient with severe systemic disease. After reviewing                         the risks and benefits, the patient was deemed in                         satisfactory condition to undergo the procedure. The                         anesthesia plan was to use monitored anesthesia care                         (MAC). Immediately prior to administration of                         medications, the patient was re-assessed for adequacy                         to receive sedatives. The heart rate, respiratory                         rate, oxygen saturations, blood pressure, adequacy of                         pulmonary ventilation, and response to care were                         monitored throughout the procedure. The physical                         status of the patient was re-assessed after the                         procedure.                        After obtaining informed consent, the endoscope was                         passed under direct vision. Throughout the procedure,                         the patient's blood pressure, pulse, and oxygen                         saturations were monitored continuously. The                         Endosonoscope was introduced through the mouth, and  advanced to the second part of duodenum. The upper GI                         endoscopy was accomplished without difficulty. The                         patient tolerated the procedure well. Findings:      The examined esophagus was normal.      Multiple 2 to 10 mm pedunculated and sessile fundic gland polyps with no       bleeding and no stigmata of recent bleeding were found in the gastric       fundus and in the gastric body. Biopsies were taken with a cold forceps       for histology. Estimated blood loss was minimal.      Patchy minimal inflammation characterized by erythema was found in the       gastric antrum. Biopsies were taken  with a cold forceps for Helicobacter       pylori testing. Estimated blood loss was minimal.      The examined duodenum was normal. Impression:            - Normal esophagus.                        - Multiple fundic gland polyps. Biopsied.                        - Gastritis. Biopsied.                        - Normal examined duodenum. Recommendation:        - Discharge patient to home.                        - Resume previous diet.                        - Continue present medications.                        - Await pathology results.                        - Return to referring physician as previously                         scheduled. Procedure Code(s):     --- Professional ---                        [id number], Esophagogastroduodenoscopy, flexible,                         transoral; with biopsy, single or multiple Diagnosis Code(s):     --- Professional ---                        K31.7, Polyp of stomach and duodenum                        K29.70, Gastritis, unspecified, without bleeding  R10.10, Upper abdominal pain, unspecified                        R10.13, Epigastric pain CPT copyright 2022 American Medical Association. All rights reserved. The codes documented in this report are preliminary and upon coder review may  be revised to meet current compliance requirements. Eather Colas MD, MD 01/13/2023 8:24:25 AM Number of Addenda: 0 Note Initiated On: 01/13/2023 7:30 AM Estimated Blood Loss:  Estimated blood loss was minimal.      [facility]

## 2023-01-13 NOTE — Anesthesia Postprocedure Evaluation (Signed)
Anesthesia Post Note  Patient: Leslie Waller  Procedure(s) Performed: COLONOSCOPY WITH PROPOFOL ESOPHAGOGASTRODUODENOSCOPY (EGD) WITH PROPOFOL BIOPSY POLYPECTOMY  Patient location during evaluation: PACU Anesthesia Type: General Level of consciousness: awake and alert, oriented and patient cooperative Pain management: pain level controlled Vital Signs Assessment: post-procedure vital signs reviewed and stable Respiratory status: spontaneous breathing, nonlabored ventilation and respiratory function stable Cardiovascular status: blood pressure returned to baseline and stable Postop Assessment: adequate PO intake Anesthetic complications: no   No notable events documented.   Last Vitals:  Vitals:   01/13/23 0834 01/13/23 0844  BP: 114/79 (!) 130/94  Pulse: 96 (!) 102  Resp: 15 17  Temp:    SpO2: 95% 97%    Last Pain:  Vitals:   01/13/23 0834  TempSrc:   PainSc: 0-No pain                 Reed Breech

## 2023-01-13 NOTE — Interval H&P Note (Signed)
History and Physical Interval Note:  01/13/2023 7:46 AM  Leslie Waller  has presented today for surgery, with the diagnosis of ABDOMINAL PAIN,DYSPEPSIA,CCA SCREEN.  The various methods of treatment have been discussed with the patient and family. After consideration of risks, benefits and other options for treatment, the patient has consented to  Procedure(s): COLONOSCOPY WITH PROPOFOL (N/A) ESOPHAGOGASTRODUODENOSCOPY (EGD) WITH PROPOFOL (N/A) as a surgical intervention.  The patient's history has been reviewed, patient examined, no change in status, stable for surgery.  I have reviewed the patient's chart and labs.  Questions were answered to the patient's satisfaction.     Regis Bill  Ok to proceed with EGD/Colonoscopy

## 2023-01-13 NOTE — H&P (Signed)
Outpatient short stay form Pre-procedure 01/13/2023  Regis Bill, MD  Primary Physician: Myrene Buddy, NP  Reason for visit:  Dyspepsia/Colon cancer screening  History of present illness:    52 y/o lady with history of obesity here for EGD for dyspepsia/abdominal pain and colonoscopy for colon cancer screening. No blood thinners. No family history of GI malignancies. No significant abdominal surgeries.    Current Facility-Administered Medications:    0.9 %  sodium chloride infusion, , Intravenous, Continuous, Adaria Hole, Rossie Muskrat, MD, Last Rate: 20 mL/hr at 01/13/23 0743, 1,000 mL at 01/13/23 0743  Medications Prior to Admission  Medication Sig Dispense Refill Last Dose   cyclobenzaprine (FLEXERIL) 10 MG tablet Take 1 tablet (10 mg total) by mouth 3 (three) times daily as needed. 15 tablet 0 Past Month   ibuprofen (ADVIL,MOTRIN) 600 MG tablet Take 1 tablet (600 mg total) by mouth every 8 (eight) hours as needed. 15 tablet 0 01/12/2023   meloxicam (MOBIC) 15 MG tablet Take 1 tablet (15 mg total) by mouth daily. 30 tablet 0 Past Week   omeprazole (PRILOSEC) 40 MG capsule Take 40 mg by mouth daily.   Past Week   benzonatate (TESSALON) 100 MG capsule Take 1-2 tablets 3 times a day as needed for cough (Patient not taking: Reported on 01/13/2023) 30 capsule 0 Not Taking   BLACK ELDERBERRY PO Take 1 capsule by mouth daily. (Patient not taking: Reported on 01/13/2023)   Not Taking   Magnesium 250 MG TABS Take 250 mg by mouth daily. (Patient not taking: Reported on 01/13/2023)   Not Taking   methylPREDNISolone (MEDROL DOSEPAK) 4 MG TBPK tablet Take as directed (Patient not taking: Reported on 01/06/2023) 21 each 0 Completed Course   pyridOXINE (VITAMIN B-6) 100 MG tablet Take 100 mg by mouth daily. (Patient not taking: Reported on 01/13/2023)   Not Taking     Allergies  Allergen Reactions   Penicillins Hives and Rash   Clindamycin/Lincomycin     "GI upset"     Past Medical  History:  Diagnosis Date   GERD (gastroesophageal reflux disease)    PONV (postoperative nausea and vomiting)     Review of systems:  Otherwise negative.    Physical Exam  Gen: Alert, oriented. Appears stated age.  HEENT: PERRLA. Lungs: No respiratory distress CV: RRR Abd: soft, benign, no masses Ext: No edema    Planned procedures: Proceed with EGD/colonoscopy. The patient understands the nature of the planned procedure, indications, risks, alternatives and potential complications including but not limited to bleeding, infection, perforation, damage to internal organs and possible oversedation/side effects from anesthesia. The patient agrees and gives consent to proceed.  Please refer to procedure notes for findings, recommendations and patient disposition/instructions.     Regis Bill, MD [facility] Gastroenterology

## 2023-01-13 NOTE — Transfer of Care (Signed)
Immediate Anesthesia Transfer of Care Note  Patient: Leslie Waller  Procedure(s) Performed: COLONOSCOPY WITH PROPOFOL ESOPHAGOGASTRODUODENOSCOPY (EGD) WITH PROPOFOL BIOPSY POLYPECTOMY  Patient Location: PACU  Anesthesia Type:General  Level of Consciousness: awake, alert , and oriented  Airway & Oxygen Therapy: Patient Spontanous Breathing  Post-op Assessment: Report given to RN and Post -op Vital signs reviewed and stable  Post vital signs: Reviewed and stable  Last Vitals:  Vitals Value Taken Time  BP 110/72 01/13/23 0824  Temp 97.5   Pulse 110 01/13/23 0825  Resp 15 01/13/23 0825  SpO2 96 % 01/13/23 0825  Vitals shown include unfiled device data.  Last Pain:  Vitals:   01/13/23 0824  TempSrc:   PainSc: 0-No pain         Complications: No notable events documented.

## 2023-01-13 NOTE — Anesthesia Preprocedure Evaluation (Addendum)
Anesthesia Evaluation  Patient identified by MRN, date of birth, ID band Patient awake    Reviewed: Allergy & Precautions, NPO status , Patient's Chart, lab work & pertinent test results  History of Anesthesia Complications (+) PONV and history of anesthetic complications  Airway Mallampati: I   Neck ROM: Full    Dental no notable dental hx.    Pulmonary neg pulmonary ROS   Pulmonary exam normal breath sounds clear to auscultation       Cardiovascular Exercise Tolerance: Good Normal cardiovascular exam Rhythm:Regular Rate:Normal  ECG 11/23/22: SR with sinus arrhythmia and occasional PVCs; incomplete RBBB   Neuro/Psych negative neurological ROS     GI/Hepatic ,GERD  ,,  Endo/Other  Class 3 obesity  Renal/GU negative Renal ROS     Musculoskeletal   Abdominal   Peds  Hematology negative hematology ROS (+)   Anesthesia Other Findings   Reproductive/Obstetrics                             Anesthesia Physical Anesthesia Plan  ASA: 3  Anesthesia Plan: General   Post-op Pain Management:    Induction: Intravenous  PONV Risk Score and Plan: 4 or greater and Propofol infusion, TIVA and Treatment may vary due to age or medical condition  Airway Management Planned: Natural Airway  Additional Equipment:   Intra-op Plan:   Post-operative Plan:   Informed Consent: I have reviewed the patients History and Physical, chart, labs and discussed the procedure including the risks, benefits and alternatives for the proposed anesthesia with the patient or authorized representative who has indicated his/her understanding and acceptance.       Plan Discussed with: CRNA  Anesthesia Plan Comments: (LMA/GETA backup discussed.  Patient consented for risks of anesthesia including but not limited to:  - adverse reactions to medications - damage to eyes, teeth, lips or other oral mucosa - nerve damage  due to positioning  - sore throat or hoarseness - damage to heart, brain, nerves, lungs, other parts of body or loss of life  Informed patient about role of CRNA in peri- and intra-operative care.  Patient voiced understanding.)        Anesthesia Quick Evaluation

## 2023-01-14 ENCOUNTER — Encounter: Payer: Self-pay | Admitting: Gastroenterology

## 2023-02-19 ENCOUNTER — Other Ambulatory Visit: Payer: Self-pay | Admitting: Nurse Practitioner

## 2023-02-19 DIAGNOSIS — Z1231 Encounter for screening mammogram for malignant neoplasm of breast: Secondary | ICD-10-CM

## 2023-03-13 ENCOUNTER — Other Ambulatory Visit: Payer: Self-pay | Admitting: Nurse Practitioner

## 2023-03-13 DIAGNOSIS — Z1231 Encounter for screening mammogram for malignant neoplasm of breast: Secondary | ICD-10-CM

## 2023-04-10 ENCOUNTER — Ambulatory Visit
Admission: RE | Admit: 2023-04-10 | Discharge: 2023-04-10 | Disposition: A | Discharge status: Home / Self Care | Payer: BC Managed Care – PPO | Source: Ambulatory Visit | Attending: Nurse Practitioner | Admitting: Nurse Practitioner

## 2023-04-10 DIAGNOSIS — Z1231 Encounter for screening mammogram for malignant neoplasm of breast: Secondary | ICD-10-CM

## 2023-04-23 ENCOUNTER — Ambulatory Visit
Admission: RE | Admit: 2023-04-23 | Discharge: 2023-04-23 | Disposition: A | Discharge status: Home / Self Care | Payer: BC Managed Care – PPO | Source: Ambulatory Visit | Attending: Family Medicine | Admitting: Family Medicine

## 2023-04-23 VITALS — BP 162/85 | HR 82 | Temp 99.5°F | Ht 65.0 in | Wt 275.0 lb

## 2023-04-23 DIAGNOSIS — J988 Other specified respiratory disorders: Secondary | ICD-10-CM

## 2023-04-23 DIAGNOSIS — I10 Essential (primary) hypertension: Secondary | ICD-10-CM | POA: Diagnosis not present

## 2023-04-23 DIAGNOSIS — B9789 Other viral agents as the cause of diseases classified elsewhere: Secondary | ICD-10-CM | POA: Diagnosis not present

## 2023-04-23 LAB — SARS CORONAVIRUS 2 BY RT PCR: SARS Coronavirus 2 by RT PCR: NEGATIVE

## 2023-04-23 MED ORDER — LEVALBUTEROL TARTRATE 45 MCG/ACT IN AERO: 2.0000 | INHALATION_SPRAY | Freq: Four times a day (QID) | RESPIRATORY_TRACT | 12 refills | Status: AC | PRN

## 2023-04-23 NOTE — ED Triage Notes (Signed)
Pt c/o cough and chest congestion, body aches, ear fullness x5days  Pt took a home test for covid and it was negative.   Pt was around her daughter who has bronchitis  Pt takes OTC Berberine, AM nutrition powder, and Florify probiotics.

## 2023-04-23 NOTE — Discharge Instructions (Addendum)
Stop by the pharmacy to pick up your albuterol.   Please monitor your blood pressure at home first thing in the morning prior to eating and drinking and journal this for PCP follow-up.  If you remain persistently elevated after your symptoms improve, you may need medical management. Other recommendations for high blood pressure are to decrease the amount of salt in your diet, exercise (150 minutes of moderate intensity exercise weekly), weight loss.  You should follow up with your primary doctor in 2-3 weeks regarding today's urgent care blood pressure.  If your symptoms persist, you may need a additional cardiovascular evaluation.

## 2023-04-23 NOTE — ED Provider Notes (Signed)
[facility]    CSN: [id number] Arrival date & time: 04/23/23  1031      History   Chief Complaint Chief Complaint  Patient presents with   Cough    HPI Leslie Waller is a 52 y.o. female.   HPI  History obtained from the patient. Leslie Waller presents for productive cough that started on Sunday. The next day she woke up feeling terrible.  She has been ear fullness of both ears, rhinorrhea, headache, chest congestion, wheezing, body aches with sinus pressure.  She had to leave work yesterday at work.  Even the body of feet were hurting. Used Flonase, NyQuil and DayQuil with short-term relief.  Home COVID test was negative.  Denies vomiting, diarrhea.    Past Medical History:  Diagnosis Date   GERD (gastroesophageal reflux disease)    PONV (postoperative nausea and vomiting)     Patient Active Problem List   Diagnosis Date Noted   RLQ abdominal pain 11/04/2017   History of PCOS 11/04/2017    Past Surgical History:  Procedure Laterality Date   BIOPSY  01/13/2023   Procedure: BIOPSY;  Surgeon: Regis Bill, MD;  Location: [facility];  Service: Endoscopy;;   BREAST BIOPSY Right 06/01/2020   Procedure: BREAST BIOPSY WITH NEEDLE LOCALIZATION;  Surgeon: Earline Mayotte, MD;  Location: [facility];  Service: General;  Laterality: Right;   BREAST CYST ASPIRATION Right 07/30/2016   COLONOSCOPY WITH PROPOFOL N/A 01/13/2023   Procedure: COLONOSCOPY WITH PROPOFOL;  Surgeon: Regis Bill, MD;  Location: [facility];  Service: Endoscopy;  Laterality: N/A;   ESOPHAGOGASTRODUODENOSCOPY (EGD) WITH PROPOFOL N/A 01/13/2023   Procedure: ESOPHAGOGASTRODUODENOSCOPY (EGD) WITH PROPOFOL;  Surgeon: Regis Bill, MD;  Location: [facility];  Service: Endoscopy;  Laterality: N/A;   POLYPECTOMY  01/13/2023   Procedure: POLYPECTOMY;  Surgeon: Regis Bill, MD;  Location: [facility];  Service: Endoscopy;;   TONSILLECTOMY      OB History     Gravida  0    Para  0   Term  0   Preterm  0   AB  0   Living  0      SAB  0   IAB  0   Ectopic  0   Multiple  0   Live Births  0            Home Medications    Prior to Admission medications   Medication Sig Start Date End Date Taking? Authorizing Provider  cyclobenzaprine (FLEXERIL) 10 MG tablet Take 1 tablet (10 mg total) by mouth 3 (three) times daily as needed. 08/09/18  Yes Joni Reining, PA-C  ibuprofen (ADVIL,MOTRIN) 600 MG tablet Take 1 tablet (600 mg total) by mouth every 8 (eight) hours as needed. 08/09/18  Yes Joni Reining, PA-C  levalbuterol [facility] HFA) 45 MCG/ACT inhaler Inhale 2 puffs into the lungs every 6 (six) hours as needed for wheezing. 04/23/23  Yes Ata Pecha, DO  losartan (COZAAR) 50 MG tablet Take 1 tablet by mouth daily. 03/11/23  Yes [provider]  meclizine (ANTIVERT) 25 MG tablet Take by mouth. 09/24/16  Yes [provider]  meloxicam (MOBIC) 15 MG tablet Take 1 tablet (15 mg total) by mouth daily. 09/17/21  Yes Candelaria Stagers, DPM  omeprazole (PRILOSEC) 40 MG capsule Take 40 mg by mouth daily.   Yes [provider]  BLACK ELDERBERRY PO Take 1 capsule by mouth daily. Patient not taking: Reported on 01/13/2023  [provider]  Magnesium 250 MG TABS Take 250 mg by mouth daily. Patient not taking: Reported on 01/13/2023    [provider]  pyridOXINE (VITAMIN B-6) 100 MG tablet Take 100 mg by mouth daily. Patient not taking: Reported on 01/13/2023    [provider]    Family History History reviewed. No pertinent family history.  Social History Social History   Tobacco Use   Smoking status: Never   Smokeless tobacco: Never  Vaping Use   Vaping status: Never Used  Substance Use Topics   Alcohol use: Yes    Comment: occ   Drug use: Never     Allergies   Penicillins and Clindamycin/lincomycin   Review of Systems Review of Systems: negative unless otherwise stated in HPI.       Physical Exam Triage Vital Signs ED Triage Vitals  Encounter Vitals Group     BP 04/23/23 1120 (!) 162/85     Systolic BP Percentile --      Diastolic BP Percentile --      Pulse Rate 04/23/23 1120 82     Resp --      Temp 04/23/23 1120 99.5 F (37.5 C)     Temp Source 04/23/23 1120 Oral     SpO2 04/23/23 1120 91 %     Weight 04/23/23 1117 275 lb (124.7 kg)     Height 04/23/23 1117 5\' 5"  (1.651 m)     Head Circumference --      Peak Flow --      Pain Score 04/23/23 1117 7     Pain Loc --      Pain Education --      Exclude from Growth Chart --    No data found.  Updated Vital Signs BP (!) 162/85 (BP Location: Left Arm)   Pulse 82   Temp 99.5 F (37.5 C) (Oral)   Ht 5\' 5"  (1.651 m)   Wt 124.7 kg   LMP 03/31/2023   SpO2 91%   BMI 45.76 kg/m   Visual Acuity Right Eye Distance:   Left Eye Distance:   Bilateral Distance:    Right Eye Near:   Left Eye Near:    Bilateral Near:     Physical Exam GEN:     alert, non-toxic appearing female in no distress    HENT:  mucus membranes moist, oropharyngeal without lesions, no erythema, no tonsils visible, no exudates, clear nasal discharge,  bilateral TM normal EYES:   pupils equal and reactive,  no scleral injection or discharge NECK:  normal ROM,  no meningismus   RESP:  no increased work of breathing,  clear to auscultation bilaterally CVS:   regular rate  and rhythm Skin:   warm and dry, no rash on visible skin     UC Treatments / Results  Labs (all labs ordered are listed, but only abnormal results are displayed) Labs Reviewed  SARS CORONAVIRUS 2 BY RT PCR    EKG   Radiology No results found.  Procedures Procedures (including critical care time)  Medications Ordered in UC Medications - No data to display  Initial Impression / Assessment and Plan / UC Course  I have reviewed the triage vital signs and the nursing notes.  Pertinent labs & imaging results that were available during my care of  the patient were reviewed by me and considered in my medical decision making (see chart for details).       Pt is a 52 y.o. female  who presents for 5 days of respiratory symptoms. [name] is  afebrile here without recent antipyretics. Satting well on room air. Overall pt is  non-toxic appearing, well hydrated, without respiratory distress. Pulmonary exam  is unremarkable.  COVID testing obtained  and was negative.  Marland Kitchen History consistent with  viral respiratory illness. Discussed symptomatic treatment.  Explained lack of efficacy of antibiotics in viral disease.  Typical duration of symptoms discussed.  Prescribed lev albuterol for wheezing as she has history of anxiety.  Blood pressure is elevated here at 162/85.  She was just recently diagnosed with hypertension and started taking losartan 50 mg daily.  She denies needing any refills or missing any doses of her medication.  This could be secondary to using over-the-counter decongestants and her NyQuil intake wear pattern advised her to get a blood pressure cuff and check her blood pressure regularly and document this and a log to take to her primary care doctor.  Follow-up with her PCP in 2 to 3 weeks regarding her blood pressures as she may need to have her losartan increased or starting a second agent.  Discussed  diet and exercise being a major component of controlling her blood pressure and she is motivated to not needing to take blood pressure medications in the future.  Return and ED precautions given and voiced understanding. Discussed MDM, treatment plan and plan for follow-up with patient who agrees with plan.     Final Clinical Impressions(s) / UC Diagnoses   Final diagnoses:  Viral respiratory illness  Elevated blood pressure reading with diagnosis of hypertension     Discharge Instructions      Stop by the pharmacy to pick up your albuterol.   Please monitor your blood pressure at home first thing in the morning prior to eating  and drinking and journal this for PCP follow-up.  If you remain persistently elevated after your symptoms improve, you may need medical management. Other recommendations for high blood pressure are to decrease the amount of salt in your diet, exercise (150 minutes of moderate intensity exercise weekly), weight loss.  You should follow up with your primary doctor in 2-3 weeks regarding today's urgent care blood pressure.  If your symptoms persist, you may need a additional cardiovascular evaluation.         ED Prescriptions     Medication Sig Dispense Auth. Provider   levalbuterol [facility] HFA) 45 MCG/ACT inhaler Inhale 2 puffs into the lungs every 6 (six) hours as needed for wheezing. 1 each Katha Cabal, DO      PDMP not reviewed this encounter.   Katha Cabal, DO 04/23/23 1247

## 2023-07-14 ENCOUNTER — Encounter: Payer: Self-pay | Admitting: Emergency Medicine

## 2023-07-14 ENCOUNTER — Ambulatory Visit
Admission: EM | Admit: 2023-07-14 | Discharge: 2023-07-14 | Disposition: A | Discharge status: Home / Self Care | Payer: 59 | Attending: Emergency Medicine | Admitting: Emergency Medicine

## 2023-07-14 DIAGNOSIS — N3000 Acute cystitis without hematuria: Secondary | ICD-10-CM | POA: Diagnosis not present

## 2023-07-14 DIAGNOSIS — R350 Frequency of micturition: Secondary | ICD-10-CM | POA: Insufficient documentation

## 2023-07-14 DIAGNOSIS — R300 Dysuria: Secondary | ICD-10-CM | POA: Insufficient documentation

## 2023-07-14 DIAGNOSIS — R3129 Other microscopic hematuria: Secondary | ICD-10-CM | POA: Diagnosis present

## 2023-07-14 LAB — WET PREP, GENITAL
Clue Cells Wet Prep HPF POC: NONE SEEN
Sperm: NONE SEEN
Trich, Wet Prep: NONE SEEN
WBC, Wet Prep HPF POC: >=10 — AB (ref <10)
Yeast Wet Prep HPF POC: NONE SEEN

## 2023-07-14 LAB — URINALYSIS, W/ REFLEX TO CULTURE (INFECTION SUSPECTED)
Bacteria, UA: NONE SEEN
Bilirubin Urine: NEGATIVE
Glucose, UA: NEGATIVE mg/dL
Ketones, ur: NEGATIVE mg/dL
Leukocytes,Ua: NEGATIVE
Nitrite: NEGATIVE
Protein, ur: NEGATIVE mg/dL
RBC / HPF: NONE SEEN RBC/hpf (ref 0–5)
Specific Gravity, Urine: 1.015 (ref 1.005–1.030)
Squamous Epithelial / HPF: NONE SEEN /[HPF] (ref 0–5)
WBC, UA: NONE SEEN WBC/hpf (ref 0–5)
pH: 7 (ref 5.0–8.0)

## 2023-07-14 MED ORDER — PHENAZOPYRIDINE HCL 200 MG PO TABS: 200.0000 mg | ORAL_TABLET | Freq: Three times a day (TID) | ORAL | 0 refills | Status: DC | PRN

## 2023-07-14 MED ORDER — TAMSULOSIN HCL 0.4 MG PO CAPS: 0.4000 mg | ORAL_CAPSULE | Freq: Every day | ORAL | 0 refills | Status: AC

## 2023-07-14 NOTE — Discharge Instructions (Addendum)
 You do not have a urinary tract infection, but you do have moderate blood in it.  This could be from a kidney stone or from a number of other things.  I would advise having your urine rechecked when you get back from your trip by your PCP to make sure that this is cleared.  The Pyridium  will help with your symptoms, but will turn your urine orange.  I am sending you home with Flomax  just in case you are passing small stones causing your symptoms.  Make sure you drink plenty of extra fluids.  Go to the ER for vomiting, fevers above 100.4, severe back pain, blood in your urine, or for other concerns

## 2023-07-14 NOTE — ED Provider Notes (Signed)
 HPI  SUBJECTIVE:  Leslie Waller is a 53 y.o. female who presents with dysuria, urgency, frequency, odorous urine for the past 2 days.  No cloudy odorous urine, hematuria.  No nausea, vomiting, fevers, abdominal, pelvic pain.  No change in her baseline back pain.  No vaginal odor, bleeding, discharge or itching.  She is not a long-term monogamous relationship with her husband, who is asymptomatic.  STDs are not a concern today.  No send antibiotics.  No antipyretic in the past 6 hours.  She tried Advil  with improvement in her symptoms.  Symptoms are worse with urination.  She has a past medical history of UTI and states this feels similar to that.  She also has a history of vaginal yeast infections and hypertension.  No history of pyelonephritis, nephrolithiasis, STDs, BV, diabetes.  [facility].    Past Medical History:  Diagnosis Date   GERD (gastroesophageal reflux disease)    PONV (postoperative nausea and vomiting)     Past Surgical History:  Procedure Laterality Date   BIOPSY  01/13/2023   Procedure: BIOPSY;  Surgeon: Maryruth Ole DASEN, MD;  Location: [facility];  Service: Endoscopy;;   BREAST BIOPSY Right 06/01/2020   Procedure: BREAST BIOPSY WITH NEEDLE LOCALIZATION;  Surgeon: Dessa Reyes LELON, MD;  Location: [facility];  Service: General;  Laterality: Right;   BREAST CYST ASPIRATION Right 07/30/2016   COLONOSCOPY WITH PROPOFOL  N/A 01/13/2023   Procedure: COLONOSCOPY WITH PROPOFOL ;  Surgeon: Maryruth Ole DASEN, MD;  Location: [facility];  Service: Endoscopy;  Laterality: N/A;   ESOPHAGOGASTRODUODENOSCOPY (EGD) WITH PROPOFOL  N/A 01/13/2023   Procedure: ESOPHAGOGASTRODUODENOSCOPY (EGD) WITH PROPOFOL ;  Surgeon: Maryruth Ole DASEN, MD;  Location: [facility];  Service: Endoscopy;  Laterality: N/A;   POLYPECTOMY  01/13/2023   Procedure: POLYPECTOMY;  Surgeon: Maryruth Ole DASEN, MD;  Location: [facility] ENDOSCOPY;  Service: Endoscopy;;   TONSILLECTOMY       History reviewed. No pertinent family history.  Social History   Tobacco Use   Smoking status: Never   Smokeless tobacco: Never  Vaping Use   Vaping status: Never Used  Substance Use Topics   Alcohol use: Yes    Comment: occ   Drug use: Never    No current facility-administered medications for this encounter.  Current Outpatient Medications:    phenazopyridine  (PYRIDIUM ) 200 MG tablet, Take 1 tablet (200 mg total) by mouth 3 (three) times daily as needed for pain., Disp: 6 tablet, Rfl: 0   tamsulosin  (FLOMAX ) 0.4 MG CAPS capsule, Take 1 capsule (0.4 mg total) by mouth at bedtime for 7 days., Disp: 7 capsule, Rfl: 0   ibuprofen  (ADVIL ,MOTRIN ) 600 MG tablet, Take 1 tablet (600 mg total) by mouth every 8 (eight) hours as needed., Disp: 15 tablet, Rfl: 0   levalbuterol  (XOPENEX  HFA) 45 MCG/ACT inhaler, Inhale 2 puffs into the lungs every 6 (six) hours as needed for wheezing., Disp: 1 each, Rfl: 12   losartan (COZAAR) 50 MG tablet, Take 1 tablet by mouth daily., Disp: , Rfl:    Magnesium 250 MG TABS, Take 250 mg by mouth daily. (Patient not taking: Reported on 01/13/2023), Disp: , Rfl:    meclizine (ANTIVERT) 25 MG tablet, Take by mouth., Disp: , Rfl:    meloxicam  (MOBIC ) 15 MG tablet, Take 1 tablet (15 mg total) by mouth daily., Disp: 30 tablet, Rfl: 0   omeprazole (PRILOSEC) 40 MG capsule, Take 40 mg by mouth daily., Disp: , Rfl:   Allergies  Allergen Reactions  Penicillins Hives and Rash   Clindamycin/Lincomycin     GI upset     ROS  As noted in HPI.   Physical Exam  BP (!) 147/88 (BP Location: Left Arm)   Pulse 90   Temp 98.3 F (36.8 C) (Oral)   Resp 18   LMP 05/20/2023   SpO2 98%   Constitutional: Well developed, well nourished, no acute distress Eyes:  EOMI, conjunctiva normal bilaterally HENT: Normocephalic, atraumatic,mucus membranes moist Respiratory: Normal inspiratory effort Cardiovascular: Normal rate GI: nondistended.  Soft.  Nontender.  No  suprapubic, flank tenderness Back: No CVAT skin: No rash, skin intact Musculoskeletal: no deformities Neurologic: Alert & oriented x 3, no focal neuro deficits Psychiatric: Speech and behavior appropriate   ED Course   Medications - No data to display  Orders Placed This Encounter  Procedures   Wet prep, genital    Standing Status:   Standing    Number of Occurrences:   1   Urinalysis, w/ Reflex to Culture (Infection Suspected) -Urine, Clean Catch    Standing Status:   Standing    Number of Occurrences:   1    Specimen Source:   Urine, Clean Catch [76]    Results for orders placed or performed during the hospital encounter of 07/14/23 (from the past 24 hours)  Urinalysis, w/ Reflex to Culture (Infection Suspected) -Urine, Clean Catch     Status: Abnormal   Collection Time: 07/14/23  8:21 AM  Result Value Ref Range   Specimen Source URINE, CLEAN CATCH    Color, Urine YELLOW YELLOW   APPearance CLEAR CLEAR   Specific Gravity, Urine 1.015 1.005 - 1.030   pH 7.0 5.0 - 8.0   Glucose, UA NEGATIVE NEGATIVE mg/dL   Hgb urine dipstick MODERATE (A) NEGATIVE   Bilirubin Urine NEGATIVE NEGATIVE   Ketones, ur NEGATIVE NEGATIVE mg/dL   Protein, ur NEGATIVE NEGATIVE mg/dL   Nitrite NEGATIVE NEGATIVE   Leukocytes,Ua NEGATIVE NEGATIVE   Squamous Epithelial / HPF NONE SEEN 0 - 5 /HPF   WBC, UA NONE SEEN 0 - 5 WBC/hpf   RBC / HPF NONE SEEN 0 - 5 RBC/hpf   Bacteria, UA NONE SEEN NONE SEEN  Wet prep, genital     Status: Abnormal   Collection Time: 07/14/23  9:17 AM  Result Value Ref Range   Yeast Wet Prep HPF POC NONE SEEN NONE SEEN   Trich, Wet Prep NONE SEEN NONE SEEN   Clue Cells Wet Prep HPF POC NONE SEEN NONE SEEN   WBC, Wet Prep HPF POC >=10 (A) <10   Sperm NONE SEEN    No results found.  ED Clinical Impression  1. Dysuria   2. Urinary frequency   3. Other microscopic hematuria       ED Assessment/Plan     UA with moderate hematuria.  No nitrites, esterase,  bacteria.  Discussed this with patient.  We did a KUB evaluating for obstructing nephrolithiasis, patient declined, which I think is reasonable given absence of pelvic, abdominal, back pain or tenderness.  Will check a wet prep.  Will still send home with Pyridium , with some Flomax  in case she is passing small stones, and have her continue increasing fluids.  She will need to have her urine rechecked by her PCP when she gets back in town from her international trip.  Strict ER return precautions given.  Wet prep negative.  Plan as above.  Discussed labs, MDM, treatment plan, and plan for follow-up with patient.  Discussed sn/sx that should prompt return to the ED. patient agrees with plan.   Meds ordered this encounter  Medications   phenazopyridine  (PYRIDIUM ) 200 MG tablet    Sig: Take 1 tablet (200 mg total) by mouth 3 (three) times daily as needed for pain.    Dispense:  6 tablet    Refill:  0   tamsulosin  (FLOMAX ) 0.4 MG CAPS capsule    Sig: Take 1 capsule (0.4 mg total) by mouth at bedtime for 7 days.    Dispense:  7 capsule    Refill:  0      *This clinic note was created using Scientist, clinical (histocompatibility and immunogenetics). Therefore, there may be occasional mistakes despite careful proofreading.  ?    Van Knee, MD 07/16/23 1622

## 2023-07-14 NOTE — ED Triage Notes (Signed)
 Patient c/o urinary frequency and burning with urination x 2 days.

## 2023-08-24 ENCOUNTER — Ambulatory Visit
Admission: RE | Admit: 2023-08-24 | Discharge: 2023-08-24 | Disposition: A | Discharge status: Home / Self Care | Payer: 59 | Source: Ambulatory Visit | Attending: Physician Assistant | Admitting: Physician Assistant

## 2023-08-24 VITALS — BP 130/90 | HR 104 | Temp 98.3°F | Resp 20

## 2023-08-24 DIAGNOSIS — Z20822 Contact with and (suspected) exposure to covid-19: Secondary | ICD-10-CM | POA: Diagnosis not present

## 2023-08-24 DIAGNOSIS — I10 Essential (primary) hypertension: Secondary | ICD-10-CM | POA: Diagnosis present

## 2023-08-24 DIAGNOSIS — R5383 Other fatigue: Secondary | ICD-10-CM | POA: Diagnosis present

## 2023-08-24 DIAGNOSIS — R051 Acute cough: Secondary | ICD-10-CM | POA: Diagnosis present

## 2023-08-24 DIAGNOSIS — B349 Viral infection, unspecified: Secondary | ICD-10-CM | POA: Diagnosis present

## 2023-08-24 LAB — RESP PANEL BY RT-PCR (FLU A&B, COVID) ARPGX2
Influenza A by PCR: NEGATIVE
Influenza B by PCR: NEGATIVE
SARS Coronavirus 2 by RT PCR: NEGATIVE

## 2023-08-24 NOTE — ED Provider Notes (Signed)
 [facility]    CSN: [id number] Arrival date & time: 08/24/23  1345      History   Chief Complaint Chief Complaint  Patient presents with   Cough    Head stuffy, achey - Entered by patient   Sore Throat   Nasal Congestion   Covid Exposure    HPI Leslie Waller is a 53 y.o. female with history of hypertension and obesity. Today, she is presenting for fatigue, cough, congestion, and sore throat x 2 days. She is feeling a little short of breath and having difficulty taking a deep breath. Denies fever, sinus pain, chest pain, wheezing, abdominal pain, vomiting or diarrhea.  Patient has been exposed to COVID. Her daughter tested positive today and is accompanying patient. Patient has not been taking over-the-counter meds. No other complaints.  HPI  Past Medical History:  Diagnosis Date   GERD (gastroesophageal reflux disease)    PONV (postoperative nausea and vomiting)     Patient Active Problem List   Diagnosis Date Noted   RLQ abdominal pain 11/04/2017   History of PCOS 11/04/2017    Past Surgical History:  Procedure Laterality Date   BIOPSY  01/13/2023   Procedure: BIOPSY;  Surgeon: Regis Bill, MD;  Location: [facility];  Service: Endoscopy;;   BREAST BIOPSY Right 06/01/2020   Procedure: BREAST BIOPSY WITH NEEDLE LOCALIZATION;  Surgeon: Earline Mayotte, MD;  Location: [facility] ORS;  Service: General;  Laterality: Right;   BREAST CYST ASPIRATION Right 07/30/2016   COLONOSCOPY WITH PROPOFOL N/A 01/13/2023   Procedure: COLONOSCOPY WITH PROPOFOL;  Surgeon: Regis Bill, MD;  Location: [facility];  Service: Endoscopy;  Laterality: N/A;   ESOPHAGOGASTRODUODENOSCOPY (EGD) WITH PROPOFOL N/A 01/13/2023   Procedure: ESOPHAGOGASTRODUODENOSCOPY (EGD) WITH PROPOFOL;  Surgeon: Regis Bill, MD;  Location: [facility];  Service: Endoscopy;  Laterality: N/A;   POLYPECTOMY  01/13/2023   Procedure: POLYPECTOMY;  Surgeon: Regis Bill, MD;   Location: [facility];  Service: Endoscopy;;   TONSILLECTOMY      OB History     Gravida  0   Para  0   Term  0   Preterm  0   AB  0   Living  0      SAB  0   IAB  0   Ectopic  0   Multiple  0   Live Births  0            Home Medications    Prior to Admission medications   Medication Sig Start Date End Date Taking? Authorizing Provider  Vitamin D, Ergocalciferol, (DRISDOL) 1.25 MG (50000 UNIT) CAPS capsule Take 50,000 Units by mouth once a week. 06/29/23  Yes [provider]  ZEPBOUND 2.5 MG/0.5ML Pen  08/24/23  Yes [provider]  ibuprofen (ADVIL,MOTRIN) 600 MG tablet Take 1 tablet (600 mg total) by mouth every 8 (eight) hours as needed. 08/09/18   Joni Reining, PA-C  levalbuterol [facility] HFA) 45 MCG/ACT inhaler Inhale 2 puffs into the lungs every 6 (six) hours as needed for wheezing. 04/23/23   Brimage, Seward Meth, DO  losartan (COZAAR) 50 MG tablet Take 1 tablet by mouth daily. 03/11/23   [provider]  Magnesium 250 MG TABS Take 250 mg by mouth daily. Patient not taking: Reported on 01/13/2023    [provider]  meclizine (ANTIVERT) 25 MG tablet Take by mouth. 09/24/16   [provider]  meloxicam (MOBIC) 15 MG tablet Take 1 tablet (15  mg total) by mouth daily. 09/17/21   Candelaria Stagers, DPM  omeprazole (PRILOSEC) 40 MG capsule Take 40 mg by mouth daily.    [provider]    Family History No family history on file.  Social History Social History   Tobacco Use   Smoking status: Never   Smokeless tobacco: Never  Vaping Use   Vaping status: Never Used  Substance Use Topics   Alcohol use: Yes    Comment: occ   Drug use: Never     Allergies   Penicillins and Clindamycin/lincomycin   Review of Systems Review of Systems  Constitutional:  Positive for fatigue. Negative for chills, diaphoresis and fever.  HENT:  Positive for congestion, rhinorrhea and sore throat. Negative for ear pain,  sinus pressure and sinus pain.   Respiratory:  Positive for cough and shortness of breath.   Cardiovascular:  Negative for chest pain.  Gastrointestinal:  Negative for abdominal pain, nausea and vomiting.  Musculoskeletal:  Negative for myalgias.  Skin:  Negative for rash.  Neurological:  Negative for weakness and headaches.  Hematological:  Negative for adenopathy.     Physical Exam Triage Vital Signs ED Triage Vitals  Encounter Vitals Group     BP 08/24/23 1359 (!) 158/98     Systolic BP Percentile --      Diastolic BP Percentile --      Pulse --      Resp 08/24/23 1359 20     Temp 08/24/23 1359 98.3 F (36.8 C)     Temp Source 08/24/23 1359 Oral     SpO2 08/24/23 1359 95 %     Weight --      Height --      Head Circumference --      Peak Flow --      Pain Score 08/24/23 1356 0     Pain Loc --      Pain Education --      Exclude from Growth Chart --    No data found.  Updated Vital Signs BP (!) 130/90 (BP Location: Left Arm)   Pulse (!) 104   Temp 98.3 F (36.8 C) (Oral)   Resp 20   LMP 07/20/2023   SpO2 95%       Physical Exam Vitals and nursing note reviewed.  Constitutional:      General: She is not in acute distress.    Appearance: Normal appearance. She is not ill-appearing or toxic-appearing.  HENT:     Head: Normocephalic and atraumatic.     Right Ear: Tympanic membrane, ear canal and external ear normal.     Left Ear: Tympanic membrane, ear canal and external ear normal.     Nose: Congestion present.     Mouth/Throat:     Mouth: Mucous membranes are moist.     Pharynx: Oropharynx is clear. Posterior oropharyngeal erythema present.  Eyes:     General: No scleral icterus.       Right eye: No discharge.        Left eye: No discharge.     Conjunctiva/sclera: Conjunctivae normal.  Cardiovascular:     Rate and Rhythm: Regular rhythm. Tachycardia present.     Heart sounds: Normal heart sounds.  Pulmonary:     Effort: Pulmonary effort is normal.  No respiratory distress.     Breath sounds: Normal breath sounds. No wheezing, rhonchi or rales.  Musculoskeletal:     Cervical back: Neck supple.  Skin:    General:  Skin is dry.  Neurological:     General: No focal deficit present.     Mental Status: She is alert. Mental status is at baseline.     Motor: No weakness.     Gait: Gait normal.  Psychiatric:        Mood and Affect: Mood normal.        Behavior: Behavior normal.      UC Treatments / Results  Labs (all labs ordered are listed, but only abnormal results are displayed) Labs Reviewed  RESP PANEL BY RT-PCR (FLU A&B, COVID) [id number]    EKG   Radiology No results found.  Procedures Procedures (including critical care time)  Medications Ordered in UC Medications - No data to display  Initial Impression / Assessment and Plan / UC Course  I have reviewed the triage vital signs and the nursing notes.  Pertinent labs & imaging results that were available during my care of the patient were reviewed by me and considered in my medical decision making (see chart for details).   53 year old female with history of hypertension and obesity presents for cough, sore throat and congestion x 2 days.  Has been exposed to COVID.  Patient is afebrile.  Blood pressure elevated at 158/98.  Tachycardia with 104 bpm. On exam has nasal congestion and erythema of posterior pharynx.  Chest is clear.  Heart regular rhythm.    Respiratory panel obtained.  All negative.  Reviewed results with patient.  Explained that it is possible she still could have COVID-19 advised doing a home test in 24 to 48 hours.  Reviewed if she is positive to follow CDC guidelines and discussed the guidelines with her.    Spoke with patient about her complaint of shortness of breath and fatigue.  Offered to perform a chest x-ray to evaluate for pneumonia but she declines, stating she is not that worried about those symptoms. She says that her daughter's doctor told  her she might have bronchitis and need an antibiotic.  I explained to patient that her lungs were clear and did discuss that if her chest x-ray was positive for pneumonia I can send antibiotics, otherwise her evaluation was not consistent with a bacterial process and antibiotics would not be beneficial.  Explained that viral bronchitis can last a few weeks as well.  Care is mostly supportive.  Offered to prescribe Promethazine DM or other cough medicine but she declined stating she already has cough medicine at home.  Advised her to take her cough medicine at home, rest and increase fluids.  Thoroughly reviewed return and ER precautions.  Additionally she declines a work note.  Repeat BP is 130/90.  Reviewed keeping a check on blood pressure readings and continuing to take losartan.  Reviewed following up with PCP if consistently greater than 140/90.   Final Clinical Impressions(s) / UC Diagnoses   Final diagnoses:  Acute cough  Other fatigue  Close exposure to COVID-19 virus  Essential hypertension  Viral illness     Discharge Instructions      -COVID and flu testing negative today. You still may have COVID. Test again in 24-48 hours.  -May also have another viral illness. Care is supportive.  -Take home cough meds. Increase rest and fluids -Viral illnesses can last a couple of weeks but you should return if you have fever, worsening cough, increased chest discomfort or increased breathing issues. -BP elevated. Keep taking Losartan.  Follow-up with primary care provider sooner than annual visit if consistently  greater than 140/90.     ED Prescriptions   None    PDMP not reviewed this encounter.   Shirlee Latch, PA-C 08/24/23 1504

## 2023-08-24 NOTE — Discharge Instructions (Addendum)
-  COVID and flu testing negative today. You still may have COVID. Test again in 24-48 hours.  -May also have another viral illness. Care is supportive.  -Take home cough meds. Increase rest and fluids -Viral illnesses can last a couple of weeks but you should return if you have fever, worsening cough, increased chest discomfort or increased breathing issues. -BP elevated. Keep taking Losartan.  Follow-up with primary care provider sooner than annual visit if consistently greater than 140/90.

## 2023-08-24 NOTE — ED Triage Notes (Addendum)
 Patient presents with c/o non productive cough, sore throat, nasal congestion, x 2 days. Patient states her daughter was diagnosed with COVID.

## 2024-03-31 ENCOUNTER — Encounter: Payer: Self-pay | Admitting: Emergency Medicine

## 2024-03-31 ENCOUNTER — Emergency Department

## 2024-03-31 ENCOUNTER — Other Ambulatory Visit: Payer: Self-pay

## 2024-03-31 ENCOUNTER — Emergency Department
Admission: EM | Admit: 2024-03-31 | Discharge: 2024-03-31 | Disposition: A | Discharge status: Home / Self Care | Attending: Emergency Medicine | Admitting: Emergency Medicine

## 2024-03-31 DIAGNOSIS — R1084 Generalized abdominal pain: Secondary | ICD-10-CM

## 2024-03-31 DIAGNOSIS — K529 Noninfective gastroenteritis and colitis, unspecified: Secondary | ICD-10-CM | POA: Insufficient documentation

## 2024-03-31 LAB — CBC WITH DIFFERENTIAL/PLATELET
Abs Immature Granulocytes: 0.01 K/uL (ref 0.00–0.07)
Basophils Absolute: 0 K/uL (ref 0.0–0.1)
Basophils Relative: 1 %
Eosinophils Absolute: 0.3 K/uL (ref 0.0–0.5)
Eosinophils Relative: 4 %
HCT: 38.3 % (ref 36.0–46.0)
Hemoglobin: 12.5 g/dL (ref 12.0–15.0)
Immature Granulocytes: 0 %
Lymphocytes Relative: 37 %
Lymphs Abs: 2.4 K/uL (ref 0.7–4.0)
MCH: 29.1 pg (ref 26.0–34.0)
MCHC: 32.6 g/dL (ref 30.0–36.0)
MCV: 89.1 fL (ref 80.0–100.0)
Monocytes Absolute: 0.5 K/uL (ref 0.1–1.0)
Monocytes Relative: 7 %
Neutro Abs: 3.4 K/uL (ref 1.7–7.7)
Neutrophils Relative %: 51 %
Platelets: 262 K/uL (ref 150–400)
RBC: 4.3 MIL/uL (ref 3.87–5.11)
RDW: 13.9 % (ref 11.5–15.5)
WBC: 6.5 K/uL (ref 4.0–10.5)
nRBC: 0 % (ref 0.0–0.2)

## 2024-03-31 LAB — COMPREHENSIVE METABOLIC PANEL WITH GFR
ALT: 33 U/L (ref 0–44)
AST: 21 U/L (ref 15–41)
Albumin: 3.8 g/dL (ref 3.5–5.0)
Alkaline Phosphatase: 58 U/L (ref 38–126)
Anion gap: 11 (ref 5–15)
BUN: 11 mg/dL (ref 6–20)
CO2: 25 mmol/L (ref 22–32)
Calcium: 11.5 mg/dL — ABNORMAL HIGH (ref 8.9–10.3)
Chloride: 104 mmol/L (ref 98–111)
Creatinine, Ser: 0.76 mg/dL (ref 0.44–1.00)
GFR, Estimated: >60 mL/min (ref >60)
Glucose, Bld: 117 mg/dL — ABNORMAL HIGH (ref 70–99)
Potassium: 3.6 mmol/L (ref 3.5–5.1)
Sodium: 140 mmol/L (ref 135–145)
Total Bilirubin: 0.3 mg/dL (ref 0.0–1.2)
Total Protein: 6.6 g/dL (ref 6.5–8.1)

## 2024-03-31 LAB — URINALYSIS, ROUTINE W REFLEX MICROSCOPIC
Bilirubin Urine: NEGATIVE
Glucose, UA: NEGATIVE mg/dL
Hgb urine dipstick: NEGATIVE
Ketones, ur: NEGATIVE mg/dL
Leukocytes,Ua: NEGATIVE
Nitrite: NEGATIVE
Protein, ur: NEGATIVE mg/dL
Specific Gravity, Urine: 1.004 — ABNORMAL LOW (ref 1.005–1.030)
pH: 7 (ref 5.0–8.0)

## 2024-03-31 LAB — LIPASE, BLOOD: Lipase: 60 U/L — ABNORMAL HIGH (ref 11–51)

## 2024-03-31 LAB — POC URINE PREG, ED: Preg Test, Ur: NEGATIVE

## 2024-03-31 MED ORDER — DICYCLOMINE HCL 10 MG PO CAPS: 10.0000 mg | ORAL_CAPSULE | Freq: Three times a day (TID) | ORAL | 0 refills | Status: AC | PRN

## 2024-03-31 MED ORDER — ONDANSETRON 4 MG PO TBDP: ORAL_TABLET | ORAL | 0 refills | Status: AC

## 2024-03-31 MED ORDER — PANTOPRAZOLE SODIUM 40 MG IV SOLR
40.0000 mg | Freq: Once | INTRAVENOUS | Status: AC
Start: 2024-03-31 — End: 2024-03-31
  Administered 2024-03-31: 40 mg via INTRAVENOUS
  Filled 2024-03-31: qty 10

## 2024-03-31 MED ORDER — SODIUM CHLORIDE 0.9 % IV BOLUS
1000.0000 mL | Freq: Once | INTRAVENOUS | Status: AC
  Administered 2024-03-31: 1000 mL via INTRAVENOUS

## 2024-03-31 MED ORDER — MORPHINE SULFATE (PF) 4 MG/ML IV SOLN
4.0000 mg | Freq: Once | INTRAVENOUS | Status: AC
  Administered 2024-03-31: 4 mg via INTRAVENOUS
  Filled 2024-03-31: qty 1

## 2024-03-31 MED ORDER — DROPERIDOL 2.5 MG/ML IJ SOLN
1.2500 mg | Freq: Once | INTRAMUSCULAR | Status: AC
  Administered 2024-03-31: 1.25 mg via INTRAVENOUS
  Filled 2024-03-31: qty 2

## 2024-03-31 MED ORDER — KETOROLAC TROMETHAMINE 30 MG/ML IJ SOLN
15.0000 mg | Freq: Once | INTRAMUSCULAR | Status: AC
Start: 2024-03-31 — End: 2024-03-31
  Administered 2024-03-31: 15 mg via INTRAVENOUS
  Filled 2024-03-31: qty 1

## 2024-03-31 MED ORDER — IOHEXOL 350 MG/ML SOLN
100.0000 mL | Freq: Once | INTRAVENOUS | Status: AC | PRN
  Administered 2024-03-31: 100 mL via INTRAVENOUS

## 2024-03-31 MED ORDER — SUCRALFATE 1 G PO TABS: 1.0000 g | ORAL_TABLET | Freq: Four times a day (QID) | ORAL | 1 refills | Status: AC | PRN

## 2024-03-31 MED ORDER — ONDANSETRON HCL 4 MG/2ML IJ SOLN
4.0000 mg | INTRAMUSCULAR | Status: AC
  Administered 2024-03-31: 4 mg via INTRAVENOUS
  Filled 2024-03-31: qty 2

## 2024-03-31 NOTE — ED Triage Notes (Signed)
 Patient ambulatory to triage with steady gait, without difficulty or distress noted; pt reports being seen Monday at [facility] for N/V/D; symptoms began resolving but tonight had onset mid upper abd pain stabbing, nonradiating accomp by nausea & diarrhea; 3 advil  taken at 6pm, nyquil, omeprazole & tums also PTA

## 2024-03-31 NOTE — ED Provider Notes (Signed)
 [facility] Provider Note    Event Date/Time   First MD Initiated Contact with Patient 03/31/24 [phone]     (approximate)   History   Abdominal Pain   HPI Leslie Waller is a 53 y.o. female who presents for evaluation of abdominal pain as well as nausea, vomiting, and diarrhea.  Symptoms started about 3 days ago.  She says she went to an urgent care for a rapid heart rate but the symptoms had not started yet.  Symptoms started later that evening (Monday night) and then continued for about 24 hours.  They were better yesterday, but started up again today and are worse than before.  She has pain in the upper abdomen that is now radiating throughout the abdomen which is both burning and aching and relatively constant.  She also has nausea and several episodes of vomiting.  She has had at least 3 watery stools tonight.  She feels achy all over and felt like she might of had a fever.  She cannot think of anything in particular that she ate that she thinks might of made her sick.  No other people in her household are sick.  She occasionally drinks alcohol and had 2 vodka drinks and 2 beers on Sunday before the symptoms started on Monday, but they did not start until later in the day.  She has no known gallstones but last had an ultrasound 3+ years ago.     Physical Exam   Triage Vital Signs: ED Triage Vitals  Encounter Vitals Group     BP 03/31/24 0050 (!) 155/79     Girls Systolic BP Percentile --      Girls Diastolic BP Percentile --      Boys Systolic BP Percentile --      Boys Diastolic BP Percentile --      Pulse Rate 03/31/24 0050 67     Resp 03/31/24 0050 20     Temp 03/31/24 0050 (!) 97.4 F (36.3 C)     Temp Source 03/31/24 0050 Oral     SpO2 03/31/24 0050 99 %     Weight 03/31/24 0052 112.5 kg (248 lb)     Height 03/31/24 0052 1.651 m (5' 5)     Head Circumference --      Peak Flow --      Pain Score 03/31/24 0050 8     Pain Loc --      Pain  Education --      Exclude from Growth Chart --     Most recent vital signs: Vitals:   03/31/24 0545 03/31/24 0612  BP:  (!) 142/77  Pulse: 88 91  Resp: 14 19  Temp:  98.2 F (36.8 C)  SpO2: 98% 99%    General: Awake, appears very uncomfortable and in mild distress. CV:  Good peripheral perfusion.  Regular rate and rhythm. Resp:  Normal effort. Speaking easily and comfortably, no accessory muscle usage nor intercostal retractions.   Abd:  Tenderness to palpation in the upper abdomen with equivocal Murphy sign.  No guarding to palpation of the lower abdomen.  Abdomen is obese and soft.   ED Results / Procedures / Treatments   Labs (all labs ordered are listed, but only abnormal results are displayed) Labs Reviewed  COMPREHENSIVE METABOLIC PANEL WITH GFR - Abnormal; Notable for the following components:      Result Value   Glucose, Bld 117 (*)    Calcium 11.5 (*)  All other components within normal limits  LIPASE, BLOOD - Abnormal; Notable for the following components:   Lipase 60 (*)    All other components within normal limits  URINALYSIS, ROUTINE W REFLEX MICROSCOPIC - Abnormal; Notable for the following components:   Color, Urine COLORLESS (*)    APPearance CLEAR (*)    Specific Gravity, Urine 1.004 (*)    All other components within normal limits  C DIFFICILE QUICK SCREEN W PCR REFLEX    GASTROINTESTINAL PANEL BY PCR, STOOL (REPLACES STOOL CULTURE)  CBC WITH DIFFERENTIAL/PLATELET  POC URINE PREG, ED     EKG  ED ECG REPORT I, Darleene Dome, the attending physician, personally viewed and interpreted this ECG.  Date: 03/31/2024 EKG Time: 00: 58 Rate: 62 Rhythm: normal sinus rhythm QRS Axis: Right axis deviation Intervals: normal ST/T Wave abnormalities: Non-specific ST segment / T-wave changes, but no clear evidence of acute ischemia. Narrative Interpretation: no definitive evidence of acute ischemia; does not meet STEMI criteria.    RADIOLOGY See ED  course for details   PROCEDURES:  Critical Care performed: No  Procedures    IMPRESSION / MDM / ASSESSMENT AND PLAN / ED COURSE  I reviewed the triage vital signs and the nursing notes.                              Differential diagnosis includes, but is not limited to, viral or bacterial gastroenteritis, gallbladder disease (cholecystitis, biliary colic, choledocholithiasis, etc.), alcohol induced gastritis, nonspecific gastritis/GERD, less likely lower abdominal infection.  Patient's presentation is most consistent with acute presentation with potential threat to life or bodily function.  Labs/studies ordered: CMP, CBC with differential, lipase, urinalysis, urine pregnancy test, GI pathogen panel, C. difficile quick screen PCR  Interventions/Medications given:  Medications  ondansetron  (ZOFRAN ) injection 4 mg (4 mg Intravenous Given 03/31/24 0156)  morphine (PF) 4 MG/ML injection 4 mg (4 mg Intravenous Given 03/31/24 0156)  ketorolac (TORADOL) 30 MG/ML injection 15 mg (15 mg Intravenous Given 03/31/24 0156)  pantoprazole (PROTONIX) injection 40 mg (40 mg Intravenous Given 03/31/24 0156)  sodium chloride  0.9 % bolus 1,000 mL (0 mLs Intravenous Stopped 03/31/24 0511)  iohexol (OMNIPAQUE) 350 MG/ML injection 100 mL (100 mLs Intravenous Contrast Given 03/31/24 0414)  morphine (PF) 4 MG/ML injection 4 mg (4 mg Intravenous Given 03/31/24 0359)  droperidol  (INAPSINE ) 2.5 MG/ML injection 1.25 mg (1.25 mg Intravenous Given 03/31/24 0359)    (Note:  hospital course my include additional interventions and/or labs/studies not listed above.)   Vital signs are below, no tachycardia and afebrile.  Patient is very uncomfortable and I ordered medications as listed above including IV morphine 4 mg.    Will evaluate with ultrasound to rule out gallbladder disease while also checking stool studies and controlling patient's symptoms  CMP, CBC with differential are all within normal limits and there is  no leukocytosis.  Lipase is still pending   Clinical Course as of 03/31/24 0616  Thu Mar 31, 2024  0334 Urinalysis, Routine w reflex microscopic -Urine, Clean Catch(!) Normal urinalysis [CF]  0334 US  ABDOMEN LIMITED RUQ (LIVER/GB) I independently viewed and interpreted the patient's ultrasound and I see no evidence of cholelithiasis or cholecystitis.  Radiologist agreed, but also mention that the common bile duct appears somewhat prominent.  However, the patient's LFTs including bilirubin are normal and I do not believe that this represents choledocholithiasis with obstruction.  Given the patient's ongoing symptoms and abdominal  pain, I will proceed with a CT scan of the abdomen and pelvis for further assessment. [CF]  0355 Lipase(!): 60 Slightly elevated, may resent acute pancreatitis but more likely simply result of the GI disturbances she has been experiencing [CF]  0553 CT ABDOMEN PELVIS W CONTRAST I independently viewed and interpreted the patient's abd/pelvis CT, as well as reviewing the radiologist's report.  No obvious sign of diverticulitis, colitis, enteritis, etc.  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 [CF]  [phone] Patient is feeling much better.  She has not been able to provide a stool specimen and has not had a bowel movement in about 5 hours.  We talked about the results and the probable foodborne versus viral pathogen.  I gave my usual and customary management recommendations and return precautions and follow-up recommendations.   The patient's medical screening exam is reassuring with no indication of an emergent medical condition requiring hospitalization or additional evaluation at this point.  The patient is safe and appropriate for discharge and outpatient follow up.  She is comfortable with the plan [CF]    Clinical Course User Index [CF] Gordan Huxley, MD     FINAL CLINICAL IMPRESSION(S) / ED DIAGNOSES   Final diagnoses:   Generalized abdominal pain  Gastroenteritis     Rx / DC Orders   ED Discharge Orders          Ordered    ondansetron  (ZOFRAN -ODT) 4 MG disintegrating tablet        03/31/24 0559    sucralfate (CARAFATE) 1 g tablet  4 times daily PRN        03/31/24 0559    dicyclomine (BENTYL) 10 MG capsule  3 times daily PRN        03/31/24 0559             Note:  This document was prepared using Dragon voice recognition software and may include unintentional dictation errors.   Gordan Huxley, MD 03/31/24 [phone]

## 2024-03-31 NOTE — Discharge Instructions (Signed)
 We believe your symptoms are caused by either a viral infection or possibly a bad food exposure.  Either way, since your symptoms have improved, we feel it is safe for you to go home and follow up with your regular doctor.  Please read the included information and stick to a bland diet for the next two days.  Drink plenty of clear fluids, and if you were provided with a prescription, please take it according to the label instructions.    If you develop any new or worsening symptoms, including persistent vomiting not controlled with medication, fever greater than 101, severe or worsening abdominal pain, or other symptoms that concern you, please return immediately to the Emergency Department.

## 2024-03-31 NOTE — ED Notes (Signed)
 Patient transported to CT

## 2024-03-31 NOTE — ED Notes (Signed)
 IV placed and blood work sent. Patient tolerated well. Denies the need to urinate, aware a sample is needed. Collection hat placed in toilet in room. Awaiting ED provider to see.
